# Patient Record
Sex: Female | Born: 1960 | Race: White | Hispanic: No | Marital: Married | Smoking: Never smoker
Health system: Southern US, Community
[De-identification: ages and names within clinical notes are randomized; demographics above are authoritative.]

## PROBLEM LIST (undated history)

## (undated) DIAGNOSIS — T7840XA Allergy, unspecified, initial encounter: Secondary | ICD-10-CM

## (undated) DIAGNOSIS — D649 Anemia, unspecified: Secondary | ICD-10-CM

## (undated) DIAGNOSIS — F419 Anxiety disorder, unspecified: Secondary | ICD-10-CM

## (undated) HISTORY — PX: EYE SURGERY: SHX253

## (undated) HISTORY — DX: Anemia, unspecified: D64.9

## (undated) HISTORY — DX: Allergy, unspecified, initial encounter: T78.40XA

## (undated) HISTORY — DX: Anxiety disorder, unspecified: F41.9

---

## 1994-01-05 HISTORY — PX: CERVICAL BIOPSY  W/ LOOP ELECTRODE EXCISION: SUR135

## 2003-09-19 ENCOUNTER — Other Ambulatory Visit: Admission: RE | Admit: 2003-09-19 | Discharge: 2003-09-19 | Payer: Self-pay | Admitting: Gynecology

## 2004-07-11 ENCOUNTER — Ambulatory Visit (HOSPITAL_COMMUNITY): Admission: RE | Admit: 2004-07-11 | Discharge: 2004-07-11 | Payer: Self-pay | Admitting: Gynecology

## 2005-02-09 ENCOUNTER — Other Ambulatory Visit: Admission: RE | Admit: 2005-02-09 | Discharge: 2005-02-09 | Payer: Self-pay | Admitting: Gynecology

## 2005-06-30 ENCOUNTER — Other Ambulatory Visit: Admission: RE | Admit: 2005-06-30 | Discharge: 2005-06-30 | Payer: Self-pay | Admitting: Gynecology

## 2005-09-27 ENCOUNTER — Ambulatory Visit: Payer: Self-pay | Admitting: Neonatology

## 2005-09-27 ENCOUNTER — Inpatient Hospital Stay (HOSPITAL_COMMUNITY): Admission: AD | Admit: 2005-09-27 | Discharge: 2005-10-07 | Payer: Self-pay | Admitting: Gynecology

## 2005-10-03 ENCOUNTER — Encounter (INDEPENDENT_AMBULATORY_CARE_PROVIDER_SITE_OTHER): Payer: Self-pay | Admitting: *Deleted

## 2005-10-08 ENCOUNTER — Encounter: Admission: RE | Admit: 2005-10-08 | Discharge: 2005-11-07 | Payer: Self-pay | Admitting: Gynecology

## 2005-11-08 ENCOUNTER — Encounter: Admission: RE | Admit: 2005-11-08 | Discharge: 2005-12-07 | Payer: Self-pay | Admitting: Gynecology

## 2005-11-18 ENCOUNTER — Other Ambulatory Visit: Admission: RE | Admit: 2005-11-18 | Discharge: 2005-11-18 | Payer: Self-pay | Admitting: Gynecology

## 2005-12-08 ENCOUNTER — Encounter: Admission: RE | Admit: 2005-12-08 | Discharge: 2006-01-07 | Payer: Self-pay | Admitting: Gynecology

## 2006-01-08 ENCOUNTER — Encounter: Admission: RE | Admit: 2006-01-08 | Discharge: 2006-02-07 | Payer: Self-pay | Admitting: Gynecology

## 2006-02-08 ENCOUNTER — Encounter: Admission: RE | Admit: 2006-02-08 | Discharge: 2006-03-08 | Payer: Self-pay | Admitting: Gynecology

## 2006-03-09 ENCOUNTER — Encounter: Admission: RE | Admit: 2006-03-09 | Discharge: 2006-03-26 | Payer: Self-pay | Admitting: Gynecology

## 2006-03-16 ENCOUNTER — Ambulatory Visit (HOSPITAL_COMMUNITY): Admission: RE | Admit: 2006-03-16 | Discharge: 2006-03-16 | Payer: Self-pay | Admitting: Gynecology

## 2006-11-22 ENCOUNTER — Other Ambulatory Visit: Admission: RE | Admit: 2006-11-22 | Discharge: 2006-11-22 | Payer: Self-pay | Admitting: Gynecology

## 2007-02-07 ENCOUNTER — Ambulatory Visit (HOSPITAL_COMMUNITY): Admission: RE | Admit: 2007-02-07 | Discharge: 2007-02-07 | Payer: Self-pay | Admitting: Gynecology

## 2007-09-08 ENCOUNTER — Ambulatory Visit: Payer: Self-pay | Admitting: Gynecology

## 2007-09-09 ENCOUNTER — Ambulatory Visit: Payer: Self-pay | Admitting: Gynecology

## 2007-09-26 ENCOUNTER — Ambulatory Visit: Payer: Self-pay | Admitting: Gynecology

## 2007-10-04 ENCOUNTER — Ambulatory Visit: Payer: Self-pay | Admitting: Gynecology

## 2007-10-06 ENCOUNTER — Ambulatory Visit: Payer: Self-pay | Admitting: Gynecology

## 2007-10-31 ENCOUNTER — Ambulatory Visit: Payer: Self-pay | Admitting: Gynecology

## 2007-11-03 ENCOUNTER — Ambulatory Visit: Payer: Self-pay | Admitting: Gynecology

## 2007-12-07 ENCOUNTER — Encounter: Payer: Self-pay | Admitting: Gynecology

## 2007-12-07 ENCOUNTER — Ambulatory Visit: Payer: Self-pay | Admitting: Gynecology

## 2007-12-07 ENCOUNTER — Other Ambulatory Visit: Admission: RE | Admit: 2007-12-07 | Discharge: 2007-12-07 | Payer: Self-pay | Admitting: Gynecology

## 2008-12-10 ENCOUNTER — Other Ambulatory Visit: Admission: RE | Admit: 2008-12-10 | Discharge: 2008-12-10 | Payer: Self-pay | Admitting: Gynecology

## 2008-12-10 ENCOUNTER — Ambulatory Visit: Payer: Self-pay | Admitting: Gynecology

## 2010-01-23 ENCOUNTER — Ambulatory Visit
Admission: RE | Admit: 2010-01-23 | Discharge: 2010-01-23 | Payer: Self-pay | Source: Home / Self Care | Attending: Gynecology | Admitting: Gynecology

## 2010-01-23 ENCOUNTER — Other Ambulatory Visit
Admission: RE | Admit: 2010-01-23 | Discharge: 2010-01-23 | Payer: Self-pay | Source: Home / Self Care | Admitting: Gynecology

## 2010-01-23 ENCOUNTER — Other Ambulatory Visit: Payer: Self-pay | Admitting: Gynecology

## 2010-01-27 ENCOUNTER — Ambulatory Visit: Admit: 2010-01-27 | Payer: Self-pay | Admitting: Gynecology

## 2010-02-18 ENCOUNTER — Other Ambulatory Visit: Payer: BC Managed Care – PPO | Admitting: Gynecology

## 2010-02-18 DIAGNOSIS — Z13220 Encounter for screening for lipoid disorders: Secondary | ICD-10-CM

## 2010-02-18 DIAGNOSIS — R635 Abnormal weight gain: Secondary | ICD-10-CM

## 2010-02-18 DIAGNOSIS — Z833 Family history of diabetes mellitus: Secondary | ICD-10-CM

## 2010-05-23 NOTE — H&P (Signed)
Patricia, Welch              ACCOUNT NO.:  [id number]   MEDICAL RECORD NO.:  [id number]          PATIENT TYPE:  INP   LOCATION:  9156                          FACILITY:  [facility]   PHYSICIAN:  Ivor Costa. Farrel Gobble, M.D. DATE OF BIRTH:  Apr 13, 1960   DATE OF ADMISSION:  09/27/2005  DATE OF DISCHARGE:                                HISTORY & PHYSICAL   HISTORY OF PRESENT ILLNESS:  The patient is a 50 year old G1 with estimated  date of confinement of January 18, 2006, estimated gestational age of 23-5/7  weeks who called earlier this morning complaining of abdominal pain, said it  kept her up all night.  The patient states she has also had some mild  spotting.  The patient's history is complicated by history of a LEEP, and we  have counseled her extensively about risks for incompetent cervix.  [place], and we  requested that she went to the nearest hospital.  At the hospital, the  patient was found to have contractions.  She had an ultrasound which showed  an intrauterine pregnancy.  Measurements were not done, however, Cervical  length was unable to be obtained.  There was no evidence of abruption.  The  patient was started on magnesium sulfate, as she was noted to be  contracting, and then was transferred to [facility].  She arrived here  around 3:30 on magnesium 2 g an hour.  She had also received a dose of  betamethasone as well as Clindamycin for GBS coverage.  The patient here  stated that her contractions had been about a 10/10 and now were less than  10 as well as less frequent.  She has not really had fetal movements as of  yet in this pregnancy.  She had had a urinalysis at the prior hospital as  well as a CBC which was remarkable for a white count of 13.8.  H&H of 13 and  40, and platelets of 371.  She also had a chemistry panel which was  unremarkable, as well as a neutrophil count which had a slight left  shift.  The patient's pregnancy has been complicated by advanced maternal age, and  the patient is 29 at delivery.  She did not have an amniocentesis, but  elected instead to have first trimester screening done at [facility]  which was unremarkable.  The patient had a normal second trimester  ultrasound for anatomy with a normal cervical length and otherwise had been  unremarkable.  Her prenatal labs are not available at the time of this  dictation.   OBSTETRIC/GYNECOLOGIC HISTORY:  Significant only for a LEEP done roughly  1993.  Her Pap smear this pregnancy had been normal.   PAST MEDICAL HISTORY:  Negative.   PAST SURGICAL HISTORY:  Negative.   MEDICATIONS:  Prenatal vitamins and calcium.   ALLERGIES:  PENICILLIN CAUSES RASH.   SOCIAL HISTORY:  Negative.   PHYSICAL EXAMINATION:  GENERAL:  She is a well-appearing gravida, no acute  distress.  She is afebrile and her vitals are  stable.  HEART:  Regular rate.  LUNGS:  Clear to auscultation.  ABDOMEN:  Gravid, soft, nontender.  A vaginal exam was performed and showed  her cervix to be short, closed with tones, and the presenting part against  the cervix.  Fetal heart rate was assuring consistent with gestational age.  Toco shows mild contractions approximately 8-12 minutes apart.  EXTREMITIES:  Negative.   ASSESSMENT:  Preterm contractions at 23+ weeks.  The patient will continue  on magnesium sulfate.  We will repeat her catheterization and sent her urine  for a culture.  We will continue the Clindamycin.  TBS will be performed by  nursing approximately two hours after the vaginal exam.  If the patient  remains undelivered, we may consider fetal fibronectin at minimum of 24  hours post vaginal exam.  She will get a repeat ultrasound done today for  presentation, estimated fetal weight and cervical length.  The patient will  remain on bedrest with a slight pelvic tilt.  The situation was discussed  with her and her husband,  and we will have an acute consult called.      Ivor Costa. Farrel Gobble, M.D.  Electronically Signed     THL/MEDQ  D:  09/27/2005  T:  09/28/2005  Job:  [id number]

## 2010-05-23 NOTE — Discharge Summary (Signed)
Patricia Welch, Patricia Welch              ACCOUNT NO.:  [id number]   MEDICAL RECORD NO.:  [id number]          PATIENT TYPE:  INP   LOCATION:  9101                          [facility]ILITY:  [facility]   PHYSICIAN:  Juan H. Lily Peer, M.D.DATE OF BIRTH:  10/11/1960   DATE OF ADMISSION:  09/27/2005  DATE OF DISCHARGE:  10/07/2005                                 DISCHARGE SUMMARY   HISTORY:  The patient is a 50 year old gravida 1 who was admitted on  September 27, 2005 at that 23-5/[redacted] weeks gestation secondary to preterm labor  and premature cervical effacement.  The patient had visited a hospital in  [place] and transferred to [facility].  She had been on magnesium  sulfate 2 grams per hour en route, as well as she had received clindamycin  for GBS coverage since she was allergic to penicillin.  The patient with  prior history of a LEEP cervical conization in 1993.   Aggressive therapy continued when she arrived to [facility].  She  continued to contract, and she was started on magnesium sulfate 3 grams per  hour.  Of note, she had just received one dose of betamethasone in [facility] and her second dose 24 hours later at [facility].  Efforts  were being made in an effort to wean her off her magnesium sulfate and  transition her to a terbutaline pump eventually.  Terbutaline pump was not  covered by her insurance carrier, and the patient was placed on Indocin for  48 hours and then switched to Procardia 10 mg p.o. q.6h.  The patient with  reassuring fetal heart rate tracings through much of her course.  She had  had an ultrasound done.  First, on September 27, 2005, there was a  transvaginal ultrasound that demonstrated the cervix only 6.8 mm in length,  and the fetus had been in the vertex presentation; size was consistent with  dates, and AFI had been normal for gestational age.  She also had an  ultrasound on September 29, 2005, at which point there was further any  cervical  length measurement, and fetal Doppler studies. systolic/diastolic  ratios had been normal.   She also had a follow-up ultrasound October 03, 2005, where she was found  to have bulging membranes and the vertex presentation, and it was at this  point where a pelvic examination had demonstrated there was bulging  membranes and she was 3 cm by ultrasound, and on pelvic exam she was found  to be 7 cm and was transferred to labor and delivery.  Of note, she had been  treated with Macrobid for a urinary tract infection after the clindamycin  had been discontinued, due to the fact the GBS cultures had been negative,  and she had been on Lovenox 30 mg subcutaneous daily for DVT prophylaxis.  With these findings, the perinatologist who had been consulted upon  admission agreed with proceeding with vaginal delivery at this point, since  all measures were exhausted to aggressively keep this pregnancy any longer,  and the neonatal team had been notified, as well.   She underwent  a rupture of membranes in an effort to deliver vaginally and  was taken subsequently for an emergency cesarean section secondary to fetal  bradycardia.  The patient delivered a viable female infant, Apgars 5 and 7,  710 grams.  Normal maternal pelvic anatomy.  Clear amniotic fluid.  The  pathology report from the placenta demonstrated chorioamnionitis with a  three-vessel cord.   Postoperatively, the patient had a hemoglobin and hematocrit 12.3 and 35.4.  She was O positive, rubella immune.  Postoperatively, she did well.  Her  Foley catheter was discontinued after 24 hours, and she was advanced from a  clear to a regular diet and was kept in the hospital until her fourth  postoperative day, whereby her staples were removed, her incision was Steri-  Stripped and she was ready to be discharged home.   FINAL DIAGNOSES:  1. Preterm labor at 23-1/2 weeks estimated gestational age.  2. Premature cervical effacement.  3.  Fetal distress.   PROCEDURES PERFORMED:  1. Perinatal consultation.  2. Magnesium tocolysis and nifedipine tocolysis  3. Intravenous antibiotics.  4. Serial ultrasounds.  5. Continuous fetal monitoring.  6. Emergency primary lower uterine segment transverse cesarean section.   FINAL DISPOSITION AND FOLLOWUP:  The patient was discharged home on her  fourth postoperative day.  She was given a prescription for Motrin 800 mg to  take one p.o. t.i.d. p.r.n.  She was given a prescription for Lortab 5.5/500  to take once p.o. q.4-6h. p.r.n. pain.  She is to continue on her prenatal  vitamins and iron.  She is to followup in the office in 6-week for her  postpartum visit.      Juan H. Lily Peer, M.D.  Electronically Signed     JHF/MEDQ  D:  10/26/2005  T:  10/27/2005  Job:  [id number]

## 2010-05-23 NOTE — Op Note (Signed)
NAMESHELLEY, COCKE              ACCOUNT NO.:  [id number]   MEDICAL RECORD NO.:  [id number]          PATIENT TYPE:  INP   LOCATION:  9168                          [facility]   PHYSICIAN:  Juan H. Lily Peer, M.D.DATE OF BIRTH:  Aug 24, 1960   DATE OF PROCEDURE:  10/03/2005  DATE OF DISCHARGE:                                 OPERATIVE REPORT   INDICATION FOR OPERATION:  The patient is a 50 year old gravida 1, para 0 at  24-5/7 weeks' gestation, who has been in the hospital since September 23  secondary to preterm labor.  The patient had almost no cervical length  measurement, had been placed on antibiotic, had received steroids and  tocolytics and had been weaned off magnesium sulfate and was eventually on  Procardia 10 mg q.6 h.  Ultrasound this morning had demonstrated the cervix  to be 3 cm with bulging membranes.  The patient was taken to labor and  delivery, and pelvic exam demonstrated her cervix was 7-8, and she rapidly  progressed to 9.  Her membranes were ruptured.  Fetal scalp electrode had  been placed.  The patient was having some mild variables and then  subsequently the mild variables transformed into deep variables and  subsequent repetitively and eventually fetal bradycardia into the 60 beats  per minute  range with a wandering baseline.  The patient was taken to the  operating room for emergency C-section and due to the fact that she had been  on Lovenox for DVT prophylaxis and received a dose approximately 3 hours  prior to this planned C-section, she was to undergo general endotracheal  anesthesia.   PREOPERATIVE DIAGNOSES:  1. Premature cervical dilatation, 24-1/2 weeks' estimated gestational age.  2. Fetal bradycardia.   POSTOPERATIVE DIAGNOSES:  1. Premature cervical dilatation, 24-1/2 weeks' estimated gestational age.  2. Fetal bradycardia.   ANESTHESIA:  General endotracheal anesthesia.   SURGEON:  Juan H. Lily Peer, M.D.   PROCEDURE PERFORMED:   Emergency lower uterine segment transverse cesarean  section.   FINDINGS:  Viable female infant, Apgars of 5 and 7 with a weight of 710  grams in the vertex presentation.  Clear amniotic fluid.  Normal maternal  pelvic anatomy.   DESCRIPTION OF OPERATION:  After the patient was emergently taken to the  operating room, fetal heart tones had been appreciated, but there were  audible decelerations noted as well.  She had an emergency prep of her  abdomen.  A Foley catheter was inserted.  The patient was immediately  intubated.  After the drapes were in place, a Pfannenstiel skin incision was  made.  This incision was carried down through the skin, subcutaneous tissue,  down to the rectus fascia by midline nick was made.  The fascia was incised  in a transverse fashion.  A midline raphe was entered.  The peritoneum was  entered rapidly and cautiously, and the bladder flap was established.  She  had a well-developed lower uterine segment so instead of proceeding with a  vertical lower uterine segment incision, she underwent a transverse cesarean  section.  The newborn's head was delivered.  The newborn nasopharyngeal area  was bulb suctioned.  The cord was clamped, passed off immediately to  neonatologists who were in attendance, who gave the above-mentioned  parameters.  After cord blood was obtained, the placenta was delivered from  the intrauterine cavity.  The uterus was exteriorized.  The intrauterine  cavity was swept clear of remaining products of conception.  The patient had  received clindamycin 900 mg when she was moved to labor and delivery short  before delivery, so no additional antibiotic was given intraoperatively.  The placenta was submitted for histological evaluation.  The transverse  incision was closed first with a locking stitch of 0 Vicryl suture followed  by a second layer in imbricating manner with 0 Vicryl suture. normal  maternal pelvic anatomy was noted.  The uterus  was placed back in the pelvic  cavity.  The pelvic cavity was copiously irrigated with normal saline  solution.  After ascertaining adequate hemostasis, the closure was started.  The visceral peritoneum was not closed, but the rectus fascia was closed  with a running stitch 0 Vicryl suture.  The bleeders were Bovie cauterized.  The skin was reapproximated with skin clips followed by placing Xeroform  gauze and 4 x 4 dressing.  The patient was extubated, transferred to  recovery room with stable vital signs.  Blood loss was 650 mL.  Urine output  200 mL and clear.  IV fluids 2700 mL of lactated Ringer's.      Juan H. Lily Peer, M.D.  Electronically Signed     JHF/MEDQ  D:  10/03/2005  T:  10/05/2005  Job:  [id number]

## 2011-02-12 ENCOUNTER — Encounter: Payer: Self-pay | Admitting: Gynecology

## 2011-02-18 ENCOUNTER — Encounter: Payer: BC Managed Care – PPO | Admitting: Gynecology

## 2011-02-24 ENCOUNTER — Other Ambulatory Visit (HOSPITAL_COMMUNITY)
Admission: RE | Admit: 2011-02-24 | Discharge: 2011-02-24 | Disposition: A | Discharge status: Home / Self Care | Payer: BC Managed Care – PPO | Source: Ambulatory Visit | Attending: Gynecology | Admitting: Gynecology

## 2011-02-24 ENCOUNTER — Ambulatory Visit (INDEPENDENT_AMBULATORY_CARE_PROVIDER_SITE_OTHER): Payer: BC Managed Care – PPO | Admitting: Gynecology

## 2011-02-24 ENCOUNTER — Encounter: Payer: Self-pay | Admitting: Gynecology

## 2011-02-24 VITALS — BP 104/68 | Ht 62.75 in | Wt 129.0 lb

## 2011-02-24 DIAGNOSIS — N871 Moderate cervical dysplasia: Secondary | ICD-10-CM | POA: Insufficient documentation

## 2011-02-24 DIAGNOSIS — Z01419 Encounter for gynecological examination (general) (routine) without abnormal findings: Secondary | ICD-10-CM | POA: Insufficient documentation

## 2011-02-24 DIAGNOSIS — Z0000 Encounter for general adult medical examination without abnormal findings: Secondary | ICD-10-CM

## 2011-02-24 LAB — URINALYSIS W MICROSCOPIC + REFLEX CULTURE
Bilirubin Urine: NEGATIVE
Casts: NONE SEEN
Crystals: NONE SEEN
Specific Gravity, Urine: 1.02 (ref 1.005–1.030)
WBC, UA: NONE SEEN WBC/hpf (ref <3)
pH: 5.5 (ref 5.0–8.0)

## 2011-02-24 NOTE — Progress Notes (Signed)
Patricia Welch 06-May-1960 [id number]   History:    51 y.o.  for annual exam with no complaints today. 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 We have no documentation. Subsequent to that her Pap smears been normal. Last mammogram February this year normal. And no contraception. Regular menstrual cycles. Strong family history diabetes (mother non-insulin-dependent diabetic).  Past medical history,surgical history, family history and social history were all reviewed and documented in the EPIC chart.  Gynecologic History Patient's last menstrual period was 02/17/2011. Contraception: none Last Pap: 2012. Results were: normal Last mammogram: 2013. Results were: normal  Obstetric History OB History    Grav Para Term Preterm Abortions TAB SAB Ect Mult Living   1 1        0     # Outc Date GA Lbr Len/2nd Wgt Sex Del Anes PTL Lv   1 PAR                ROS:  Was performed and pertinent positives and negatives are included in the history.  Exam: chaperone present  BP 104/68  Ht 5' 2.75" (1.594 m)  Wt 129 lb (58.514 kg)  BMI 23.03 kg/m2  LMP 02/17/2011  Body mass index is 23.03 kg/(m^2).  General appearance : Well developed well nourished female. No acute distress HEENT: Neck supple, trachea midline, no carotid bruits, no thyroidmegaly Lungs: Clear to auscultation, no rhonchi or wheezes, or rib retractions  Heart: Regular rate and rhythm, no murmurs or gallops Breast:Examined in sitting and supine position were symmetrical in appearance, no palpable masses or tenderness,  no skin retraction, no nipple inversion, no nipple discharge, no skin discoloration, no axillary or supraclavicular lymphadenopathy Abdomen: no palpable masses or tenderness, no rebound or guarding Extremities: no edema or skin discoloration or tenderness  Pelvic:  Bartholin, Urethra,  Skene Glands: Within normal limits             Vagina: No gross lesions or discharge  Cervix: No gross lesions or discharge  Uterus  anteverted, normal size, shape and consistency, non-tender and mobile  Adnexa  Without masses or tenderness  Anus and perineum  normal   Rectovaginal  normal sphincter tone without palpated masses or tenderness             Hemoccult not done     Assessment/Plan:  51 y.o. female for annual exam which was normal today. Due to patient's history of severe dysplasia in the past would recommend continuing annual Pap smears. patient is encouraged to do her monthly self breast examination. The following labs will be drawn today random blood sugar, CBC, urinalysis, fasting lipid profile, along with her Pap smear. She was encouraged to calcium vitamin D for osteoporosis prevention. We'll see her back pneumonia or when necessary.   Ok Edwards MD, 9:48 AM 02/24/2011

## 2011-02-24 NOTE — Patient Instructions (Addendum)
Breast Self-Exam A self breast exam may help you find changes or problems while they are still small. Do a breast self-exam:  Every month.   One week after your period (menstrual period).   On the first day of each month if you do not have periods anymore.  Look for any:  Change in breast color, size, or shape.   Dimples in your breast.   Changes in your nipples or skin.   Dry skin on your breasts or nipples.   Watery or bloody discharge from your nipples.   Feel for:  Lumps.   Thick, hard places.   Any other changes.  HOME CARE There are 3 ways to do the breast self-exam: In front of a mirror.  Lift your arms over your head and turn side to side.   Put your hands on your hips and lean down, then turn from side to side.   Bend forward and turn from side to side.  In the shower.  With soapy hands, check both breasts. Then check above and below your collarbone and your armpits.   Feel above and below your collarbone down to under your breast, and from the center of your chest to the outer edge of the armpit. Check for any lumps or hard spots.   Using the tips of your middle three fingers check your whole breast by pressing your hand over your breast in a circle or in an up and down motion.  Lying down.  Lie flat on your bed.   Put a small pillow under the breast you are going to check. On that same side, put your hand behind your head.   With your other hand, use the 3 middle fingers to feel the breast.   Move your fingers in a circle around the breast. Press firmly over all parts of the breast to feel for any lumps.  GET HELP RIGHT AWAY IF: You find any changes in your breasts so they can be checked. Document Released: 06/10/2007 Document Revised: 09/03/2010 Document Reviewed: 04/11/2008 [facility] Patient Information 2012 [place], [place].  Remember to take one tablet of either one of the following: Caltrate Plus or Oscal or Citracal or viactiv (Source for Calcium  and Vitamin D)

## 2011-02-25 LAB — CBC WITH DIFFERENTIAL/PLATELET
HCT: 41.8 % (ref 36.0–46.0)
Lymphs Abs: 1.9 10*3/uL (ref 0.7–4.0)
Neutro Abs: 2.2 10*3/uL (ref 1.7–7.7)
Neutrophils Relative %: 49 % (ref 43–77)
Platelets: 279 10*3/uL (ref 150–400)
RBC: 4.31 MIL/uL (ref 3.87–5.11)
RDW: 12.1 % (ref 11.5–15.5)
WBC: 4.6 10*3/uL (ref 4.0–10.5)

## 2011-02-25 LAB — LIPID PANEL
LDL Cholesterol: 112 mg/dL — ABNORMAL HIGH (ref 0–99)
Total CHOL/HDL Ratio: 3.4 Ratio

## 2012-02-09 ENCOUNTER — Ambulatory Visit: Payer: BC Managed Care – PPO | Admitting: Family Medicine

## 2012-04-08 ENCOUNTER — Encounter: Payer: Self-pay | Admitting: Family Medicine

## 2012-04-08 ENCOUNTER — Ambulatory Visit (INDEPENDENT_AMBULATORY_CARE_PROVIDER_SITE_OTHER): Payer: 59 | Admitting: Family Medicine

## 2012-04-08 VITALS — BP 104/68 | HR 65 | Temp 98.3°F | Ht 62.5 in | Wt 129.6 lb

## 2012-04-08 DIAGNOSIS — Z0000 Encounter for general adult medical examination without abnormal findings: Secondary | ICD-10-CM

## 2012-04-08 DIAGNOSIS — Z8249 Family history of ischemic heart disease and other diseases of the circulatory system: Secondary | ICD-10-CM

## 2012-04-08 DIAGNOSIS — R636 Underweight: Secondary | ICD-10-CM

## 2012-04-08 LAB — CBC WITH DIFFERENTIAL/PLATELET
Basophils Relative: 0.6 % (ref 0.0–3.0)
Eosinophils Absolute: 0.1 10*3/uL (ref 0.0–0.7)
Hemoglobin: 13.6 g/dL (ref 12.0–15.0)
Lymphocytes Relative: 38.9 % (ref 12.0–46.0)
Lymphs Abs: 1.6 10*3/uL (ref 0.7–4.0)
MCHC: 34.3 g/dL (ref 30.0–36.0)
MCV: 91.9 fl (ref 78.0–100.0)
Monocytes Absolute: 0.3 10*3/uL (ref 0.1–1.0)
Monocytes Relative: 6.7 % (ref 3.0–12.0)
Neutro Abs: 2.1 10*3/uL (ref 1.4–7.7)
WBC: 4.1 10*3/uL — ABNORMAL LOW (ref 4.5–10.5)

## 2012-04-08 LAB — HEPATIC FUNCTION PANEL
ALT: 15 U/L (ref 0–35)
AST: 22 U/L (ref 0–37)
Alkaline Phosphatase: 47 U/L (ref 39–117)
Total Protein: 7.3 g/dL (ref 6.0–8.3)

## 2012-04-08 LAB — LIPID PANEL
HDL: 55.6 mg/dL (ref >39.00)
Total CHOL/HDL Ratio: 3

## 2012-04-08 LAB — BASIC METABOLIC PANEL
BUN: 12 mg/dL (ref 6–23)
GFR: 88.56 mL/min (ref >60.00)

## 2012-04-08 NOTE — Patient Instructions (Addendum)
Preventive Care for Adults, Female A healthy lifestyle and preventive care can promote health and wellness. Preventive health guidelines for women include the following key practices.  A routine yearly physical is a good way to check with your caregiver about your health and preventive screening. It is a chance to share any concerns and updates on your health, and to receive a thorough exam.  Visit your dentist for a routine exam and preventive care every 6 months. Brush your teeth twice a day and floss once a day. Good oral hygiene prevents tooth decay and gum disease.  The frequency of eye exams is based on your age, health, family medical history, use of contact lenses, and other factors. Follow your caregiver's recommendations for frequency of eye exams.  Eat a healthy diet. Foods like vegetables, fruits, whole grains, low-fat dairy products, and lean protein foods contain the nutrients you need without too many calories. Decrease your intake of foods high in solid fats, added sugars, and salt. Eat the right amount of calories for you.Get information about a proper diet from your caregiver, if necessary.  Regular physical exercise is one of the most important things you can do for your health. Most adults should get at least 150 minutes of moderate-intensity exercise (any activity that increases your heart rate and causes you to sweat) each week. In addition, most adults need muscle-strengthening exercises on 2 or more days a week.  Maintain a healthy weight. The body mass index (BMI) is a screening tool to identify possible weight problems. It provides an estimate of body fat based on height and weight. Your caregiver can help determine your BMI, and can help you achieve or maintain a healthy weight.For adults 20 years and older:  A BMI below 18.5 is considered underweight.  A BMI of 18.5 to 24.9 is normal.  A BMI of 25 to 29.9 is considered overweight.  A BMI of 30 and above is  considered obese.  Maintain normal blood lipids and cholesterol levels by exercising and minimizing your intake of saturated fat. Eat a balanced diet with plenty of fruit and vegetables. Blood tests for lipids and cholesterol should begin at age 20 and be repeated every 5 years. If your lipid or cholesterol levels are high, you are over 50, or you are at high risk for heart disease, you may need your cholesterol levels checked more frequently.Ongoing high lipid and cholesterol levels should be treated with medicines if diet and exercise are not effective.  If you smoke, find out from your caregiver how to quit. If you do not use tobacco, do not start.  If you are pregnant, do not drink alcohol. If you are breastfeeding, be very cautious about drinking alcohol. If you are not pregnant and choose to drink alcohol, do not exceed 1 drink per day. One drink is considered to be 12 ounces (355 mL) of beer, 5 ounces (148 mL) of wine, or 1.5 ounces (44 mL) of liquor.  Avoid use of street drugs. Do not share needles with anyone. Ask for help if you need support or instructions about stopping the use of drugs.  High blood pressure causes heart disease and increases the risk of stroke. Your blood pressure should be checked at least every 1 to 2 years. Ongoing high blood pressure should be treated with medicines if weight loss and exercise are not effective.  If you are 55 to 50 years old, ask your caregiver if you should take aspirin to prevent strokes.  Diabetes   screening involves taking a blood sample to check your fasting blood sugar level. This should be done once every 3 years, after age 45, if you are within normal weight and without risk factors for diabetes. Testing should be considered at a younger age or be carried out more frequently if you are overweight and have at least 1 risk factor for diabetes.  Breast cancer screening is essential preventive care for women. You should practice "breast  self-awareness." This means understanding the normal appearance and feel of your breasts and may include breast self-examination. Any changes detected, no matter how small, should be reported to a caregiver. Women in their 20s and 30s should have a clinical breast exam (CBE) by a caregiver as part of a regular health exam every 1 to 3 years. After age 40, women should have a CBE every year. Starting at age 40, women should consider having a mammography (breast X-ray test) every year. Women who have a family history of breast cancer should talk to their caregiver about genetic screening. Women at a high risk of breast cancer should talk to their caregivers about having magnetic resonance imaging (MRI) and a mammography every year.  The Pap test is a screening test for cervical cancer. A Pap test can show cell changes on the cervix that might become cervical cancer if left untreated. A Pap test is a procedure in which cells are obtained and examined from the lower end of the uterus (cervix).  Women should have a Pap test starting at age 21.  Between ages 21 and 29, Pap tests should be repeated every 2 years.  Beginning at age 30, you should have a Pap test every 3 years as long as the past 3 Pap tests have been normal.  Some women have medical problems that increase the chance of getting cervical cancer. Talk to your caregiver about these problems. It is especially important to talk to your caregiver if a new problem develops soon after your last Pap test. In these cases, your caregiver may recommend more frequent screening and Pap tests.  The above recommendations are the same for women who have or have not gotten the vaccine for human papillomavirus (HPV).  If you had a hysterectomy for a problem that was not cancer or a condition that could lead to cancer, then you no longer need Pap tests. Even if you no longer need a Pap test, a regular exam is a good idea to make sure no other problems are  starting.  If you are between ages 65 and 70, and you have had normal Pap tests going back 10 years, you no longer need Pap tests. Even if you no longer need a Pap test, a regular exam is a good idea to make sure no other problems are starting.  If you have had past treatment for cervical cancer or a condition that could lead to cancer, you need Pap tests and screening for cancer for at least 20 years after your treatment.  If Pap tests have been discontinued, risk factors (such as a new sexual partner) need to be reassessed to determine if screening should be resumed.  The HPV test is an additional test that may be used for cervical cancer screening. The HPV test looks for the virus that can cause the cell changes on the cervix. The cells collected during the Pap test can be tested for HPV. The HPV test could be used to screen women aged 30 years and older, and should   be used in women of any age who have unclear Pap test results. After the age of 30, women should have HPV testing at the same frequency as a Pap test.  Colorectal cancer can be detected and often prevented. Most routine colorectal cancer screening begins at the age of 50 and continues through age 75. However, your caregiver may recommend screening at an earlier age if you have risk factors for colon cancer. On a yearly basis, your caregiver may provide home test kits to check for hidden blood in the stool. Use of a small camera at the end of a tube, to directly examine the colon (sigmoidoscopy or colonoscopy), can detect the earliest forms of colorectal cancer. Talk to your caregiver about this at age 50, when routine screening begins. Direct examination of the colon should be repeated every 5 to 10 years through age 75, unless early forms of pre-cancerous polyps or small growths are found.  Hepatitis C blood testing is recommended for all people born from 1945 through 1965 and any individual with known risks for hepatitis C.  Practice  safe sex. Use condoms and avoid high-risk sexual practices to reduce the spread of sexually transmitted infections (STIs). STIs include gonorrhea, chlamydia, syphilis, trichomonas, herpes, HPV, and human immunodeficiency virus (HIV). Herpes, HIV, and HPV are viral illnesses that have no cure. They can result in disability, cancer, and death. Sexually active women aged 25 and younger should be checked for chlamydia. Older women with new or multiple partners should also be tested for chlamydia. Testing for other STIs is recommended if you are sexually active and at increased risk.  Osteoporosis is a disease in which the bones lose minerals and strength with aging. This can result in serious bone fractures. The risk of osteoporosis can be identified using a bone density scan. Women ages 65 and over and women at risk for fractures or osteoporosis should discuss screening with their caregivers. Ask your caregiver whether you should take a calcium supplement or vitamin D to reduce the rate of osteoporosis.  Menopause can be associated with physical symptoms and risks. Hormone replacement therapy is available to decrease symptoms and risks. You should talk to your caregiver about whether hormone replacement therapy is right for you.  Use sunscreen with sun protection factor (SPF) of 30 or more. Apply sunscreen liberally and repeatedly throughout the day. You should seek shade when your shadow is shorter than you. Protect yourself by wearing long sleeves, pants, a wide-brimmed hat, and sunglasses year round, whenever you are outdoors.  Once a month, do a whole body skin exam, using a mirror to look at the skin on your back. Notify your caregiver of new moles, moles that have irregular borders, moles that are larger than a pencil eraser, or moles that have changed in shape or color.  Stay current with required immunizations.  Influenza. You need a dose every fall (or winter). The composition of the flu vaccine  changes each year, so being vaccinated once is not enough.  Pneumococcal polysaccharide. You need 1 to 2 doses if you smoke cigarettes or if you have certain chronic medical conditions. You need 1 dose at age 65 (or older) if you have never been vaccinated.  Tetanus, diphtheria, pertussis (Tdap, Td). Get 1 dose of Tdap vaccine if you are younger than age 65, are over 65 and have contact with an infant, are a healthcare worker, are pregnant, or simply want to be protected from whooping cough. After that, you need a Td   booster dose every 10 years. Consult your caregiver if you have not had at least 3 tetanus and diphtheria-containing shots sometime in your life or have a deep or dirty wound.  HPV. You need this vaccine if you are a woman age 26 or younger. The vaccine is given in 3 doses over 6 months.  Measles, mumps, rubella (MMR). You need at least 1 dose of MMR if you were born in 1957 or later. You may also need a second dose.  Meningococcal. If you are age 19 to 21 and a first-year college student living in a residence hall, or have one of several medical conditions, you need to get vaccinated against meningococcal disease. You may also need additional booster doses.  Zoster (shingles). If you are age 60 or older, you should get this vaccine.  Varicella (chickenpox). If you have never had chickenpox or you were vaccinated but received only 1 dose, talk to your caregiver to find out if you need this vaccine.  Hepatitis A. You need this vaccine if you have a specific risk factor for hepatitis A virus infection or you simply wish to be protected from this disease. The vaccine is usually given as 2 doses, 6 to 18 months apart.  Hepatitis B. You need this vaccine if you have a specific risk factor for hepatitis B virus infection or you simply wish to be protected from this disease. The vaccine is given in 3 doses, usually over 6 months. Preventive Services / Frequency Ages 19 to 39  Blood  pressure check.** / Every 1 to 2 years.  Lipid and cholesterol check.** / Every 5 years beginning at age 20.  Clinical breast exam.** / Every 3 years for women in their 20s and 30s.  Pap test.** / Every 2 years from ages 21 through 29. Every 3 years starting at age 30 through age 65 or 70 with a history of 3 consecutive normal Pap tests.  HPV screening.** / Every 3 years from ages 30 through ages 65 to 70 with a history of 3 consecutive normal Pap tests.  Hepatitis C blood test.** / For any individual with known risks for hepatitis C.  Skin self-exam. / Monthly.  Influenza immunization.** / Every year.  Pneumococcal polysaccharide immunization.** / 1 to 2 doses if you smoke cigarettes or if you have certain chronic medical conditions.  Tetanus, diphtheria, pertussis (Tdap, Td) immunization. / A one-time dose of Tdap vaccine. After that, you need a Td booster dose every 10 years.  HPV immunization. / 3 doses over 6 months, if you are 26 and younger.  Measles, mumps, rubella (MMR) immunization. / You need at least 1 dose of MMR if you were born in 1957 or later. You may also need a second dose.  Meningococcal immunization. / 1 dose if you are age 19 to 21 and a first-year college student living in a residence hall, or have one of several medical conditions, you need to get vaccinated against meningococcal disease. You may also need additional booster doses.  Varicella immunization.** / Consult your caregiver.  Hepatitis A immunization.** / Consult your caregiver. 2 doses, 6 to 18 months apart.  Hepatitis B immunization.** / Consult your caregiver. 3 doses usually over 6 months. Ages 40 to 64  Blood pressure check.** / Every 1 to 2 years.  Lipid and cholesterol check.** / Every 5 years beginning at age 20.  Clinical breast exam.** / Every year after age 40.  Mammogram.** / Every year beginning at age 40   and continuing for as long as you are in good health. Consult with your  caregiver.  Pap test.** / Every 3 years starting at age 30 through age 65 or 70 with a history of 3 consecutive normal Pap tests.  HPV screening.** / Every 3 years from ages 30 through ages 65 to 70 with a history of 3 consecutive normal Pap tests.  Fecal occult blood test (FOBT) of stool. / Every year beginning at age 50 and continuing until age 75. You may not need to do this test if you get a colonoscopy every 10 years.  Flexible sigmoidoscopy or colonoscopy.** / Every 5 years for a flexible sigmoidoscopy or every 10 years for a colonoscopy beginning at age 50 and continuing until age 75.  Hepatitis C blood test.** / For all people born from 1945 through 1965 and any individual with known risks for hepatitis C.  Skin self-exam. / Monthly.  Influenza immunization.** / Every year.  Pneumococcal polysaccharide immunization.** / 1 to 2 doses if you smoke cigarettes or if you have certain chronic medical conditions.  Tetanus, diphtheria, pertussis (Tdap, Td) immunization.** / A one-time dose of Tdap vaccine. After that, you need a Td booster dose every 10 years.  Measles, mumps, rubella (MMR) immunization. / You need at least 1 dose of MMR if you were born in 1957 or later. You may also need a second dose.  Varicella immunization.** / Consult your caregiver.  Meningococcal immunization.** / Consult your caregiver.  Hepatitis A immunization.** / Consult your caregiver. 2 doses, 6 to 18 months apart.  Hepatitis B immunization.** / Consult your caregiver. 3 doses, usually over 6 months. Ages 65 and over  Blood pressure check.** / Every 1 to 2 years.  Lipid and cholesterol check.** / Every 5 years beginning at age 20.  Clinical breast exam.** / Every year after age 40.  Mammogram.** / Every year beginning at age 40 and continuing for as long as you are in good health. Consult with your caregiver.  Pap test.** / Every 3 years starting at age 30 through age 65 or 70 with a 3  consecutive normal Pap tests. Testing can be stopped between 65 and 70 with 3 consecutive normal Pap tests and no abnormal Pap or HPV tests in the past 10 years.  HPV screening.** / Every 3 years from ages 30 through ages 65 or 70 with a history of 3 consecutive normal Pap tests. Testing can be stopped between 65 and 70 with 3 consecutive normal Pap tests and no abnormal Pap or HPV tests in the past 10 years.  Fecal occult blood test (FOBT) of stool. / Every year beginning at age 50 and continuing until age 75. You may not need to do this test if you get a colonoscopy every 10 years.  Flexible sigmoidoscopy or colonoscopy.** / Every 5 years for a flexible sigmoidoscopy or every 10 years for a colonoscopy beginning at age 50 and continuing until age 75.  Hepatitis C blood test.** / For all people born from 1945 through 1965 and any individual with known risks for hepatitis C.  Osteoporosis screening.** / A one-time screening for women ages 65 and over and women at risk for fractures or osteoporosis.  Skin self-exam. / Monthly.  Influenza immunization.** / Every year.  Pneumococcal polysaccharide immunization.** / 1 dose at age 65 (or older) if you have never been vaccinated.  Tetanus, diphtheria, pertussis (Tdap, Td) immunization. / A one-time dose of Tdap vaccine if you are over   65 and have contact with an infant, are a healthcare worker, or simply want to be protected from whooping cough. After that, you need a Td booster dose every 10 years.  Varicella immunization.** / Consult your caregiver.  Meningococcal immunization.** / Consult your caregiver.  Hepatitis A immunization.** / Consult your caregiver. 2 doses, 6 to 18 months apart.  Hepatitis B immunization.** / Check with your caregiver. 3 doses, usually over 6 months. ** Family history and personal history of risk and conditions may change your caregiver's recommendations. Document Released: 02/17/2001 Document Revised: 03/16/2011  Document Reviewed: 05/19/2010 [employer] Patient Information 2013 [employer].  

## 2012-04-08 NOTE — Assessment & Plan Note (Deleted)
Megace rx given to pt rto 1 month or sooner prn

## 2012-04-08 NOTE — Progress Notes (Signed)
Subjective:     Patricia Welch is a 52 y.o. female and is here for a comprehensive physical exam. The patient reports no problems.  History   Social History  . Marital Status: Married    Spouse Name: N/A    Number of Children: N/A  . Years of Education: N/A   Occupational History  . [employer]--- accounting    Social History Main Topics  . Smoking status: Never Smoker   . Smokeless tobacco: Never Used  . Alcohol Use: Yes     Comment: rare glass of wine  . Drug Use: No  . Sexually Active: Yes -- Female partner(s)    Birth Control/ Protection: None   Other Topics Concern  . Not on file   Social History Narrative   Exercise--  Recently started   Health Maintenance  Topic Date Due  . Mammogram  02/10/2012  . Influenza Vaccine  09/05/2012  . Pap Smear  02/23/2014  . Tetanus/tdap  07/12/2018    The following portions of the patient's history were reviewed and updated as appropriate:  She  has no past medical history on file. She  does not have any pertinent problems on file. She  has past surgical history that includes Cesarean section and Cervical biopsy w/ loop electrode excision (1996). Her family history includes Alcohol abuse in her father; Diabetes in her mother; Heart disease (age of onset: 36) in her father; and Hypertension in her mother. She  reports that she has never smoked. She has never used smokeless tobacco. She reports that  drinks alcohol. She reports that she does not use illicit drugs. She has a current medication list which includes the following prescription(s): multiple vitamins-minerals. Current Outpatient Prescriptions on File Prior to Visit  Medication Sig Dispense Refill  . Multiple Vitamins-Minerals (MULTIVITAMIN PO) Take by mouth.       No current facility-administered medications on file prior to visit.   She is allergic to penicillins..  Review of Systems Review of Systems  Constitutional: Negative for activity change, appetite change and  fatigue.  HENT: Negative for hearing loss, congestion, tinnitus and ear discharge.  dentist q17m Eyes: Negative for visual disturbance (see optho q2y -- vision corrected to 20/20 with glasses).  Respiratory: Negative for cough, chest tightness and shortness of breath.   Cardiovascular: Negative for chest pain, palpitations and leg swelling.  Gastrointestinal: Negative for abdominal pain, diarrhea, constipation and abdominal distention.  Genitourinary: Negative for urgency, frequency, decreased urine volume and difficulty urinating.  Musculoskeletal: Negative for back pain, arthralgias and gait problem.  Skin: Negative for color change, pallor and rash.  Neurological: Negative for dizziness, light-headedness, numbness and headaches.  Hematological: Negative for adenopathy. Does not bruise/bleed easily.  Psychiatric/Behavioral: Negative for suicidal ideas, confusion, sleep disturbance, self-injury, dysphoric mood, decreased concentration and agitation.       Objective:    BP 104/68  Pulse 65  Temp(Src) 98.3 F (36.8 C) (Oral)  Ht 5' 2.5" (1.588 m)  Wt 129 lb 9.6 oz (58.786 kg)  BMI 23.31 kg/m2  SpO2 96% General appearance: AAOx3  NAD Head: Normocephalic, without obvious abnormality, atraumatic Eyes: conjunctivae/corneas clear. PERRL, EOM's intact. Fundi benign. Ears: normal TM's and external ear canals both ears Nose: Nares normal. Septum midline. Mucosa normal. No drainage or sinus tenderness. Throat: lips, mucosa, and tongue normal; teeth and gums normal Neck: no adenopathy, no carotid bruit, no JVD, supple, symmetrical, trachea midline and thyroid not enlarged, symmetric, no tenderness/mass/nodules Back: symmetric, no curvature. ROM normal. No CVA  tenderness. Lungs: clear to auscultation bilaterally Breasts: gyn Heart: regular rate and rhythm, S1, S2 normal, no murmur, click, rub or gallop Abdomen: soft, non-tender; bowel sounds normal; no masses,  no organomegaly Pelvic:  deferred-gyn Extremities: extremities normal, atraumatic, no cyanosis or edema Pulses: 2+ and symmetric Skin: Skin color, texture, turgor normal. No rashes or lesions Lymph nodes: Cervical, supraclavicular, and axillary nodes normal. Neurologic: Alert and oriented X 3, normal strength and tone. Normal symmetric reflexes. Normal coordination and gait Psych--no anxiety, no depression      Assessment:    Healthy female exam.      Plan:    check fasting labs ghm utd---pap and mammo per gyn See After Visit Summary for Counseling Recommendations

## 2012-04-12 LAB — POCT URINALYSIS DIPSTICK
Glucose, UA: NEGATIVE
Ketones, UA: NEGATIVE
Protein, UA: NEGATIVE
pH, UA: 6.5

## 2012-07-04 ENCOUNTER — Encounter: Payer: Self-pay | Admitting: Anesthesiology

## 2012-07-06 ENCOUNTER — Encounter: Payer: Self-pay | Admitting: Gynecology

## 2012-07-20 ENCOUNTER — Encounter: Payer: Self-pay | Admitting: Gynecology

## 2012-07-26 ENCOUNTER — Encounter: Payer: Self-pay | Admitting: Gynecology

## 2012-08-22 ENCOUNTER — Encounter: Payer: Self-pay | Admitting: Gynecology

## 2012-09-16 ENCOUNTER — Encounter: Payer: Self-pay | Admitting: Gynecology

## 2012-10-10 ENCOUNTER — Encounter: Payer: Self-pay | Admitting: Gynecology

## 2012-10-10 ENCOUNTER — Ambulatory Visit (INDEPENDENT_AMBULATORY_CARE_PROVIDER_SITE_OTHER): Payer: 59 | Admitting: Gynecology

## 2012-10-10 ENCOUNTER — Other Ambulatory Visit (HOSPITAL_COMMUNITY)
Admission: RE | Admit: 2012-10-10 | Discharge: 2012-10-10 | Disposition: A | Discharge status: Home / Self Care | Payer: 59 | Source: Ambulatory Visit | Attending: Gynecology | Admitting: Gynecology

## 2012-10-10 VITALS — BP 124/84 | Ht 62.0 in | Wt 129.2 lb

## 2012-10-10 DIAGNOSIS — Z01419 Encounter for gynecological examination (general) (routine) without abnormal findings: Secondary | ICD-10-CM | POA: Insufficient documentation

## 2012-10-10 DIAGNOSIS — N3281 Overactive bladder: Secondary | ICD-10-CM | POA: Insufficient documentation

## 2012-10-10 DIAGNOSIS — N318 Other neuromuscular dysfunction of bladder: Secondary | ICD-10-CM

## 2012-10-10 DIAGNOSIS — Z1151 Encounter for screening for human papillomavirus (HPV): Secondary | ICD-10-CM | POA: Insufficient documentation

## 2012-10-10 MED ORDER — FESOTERODINE FUMARATE ER 4 MG PO TB24: 4.0000 mg | ORAL_TABLET | Freq: Every day | ORAL | Status: DC

## 2012-10-10 NOTE — Progress Notes (Signed)
Patricia Welch February 14, 1960 [id number]   History:    52 y.o.  for annual gyn exam who was complaining of urinary frequency during the day and having to get up at least once at night. 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 We have no documentation. Subsequent to that her Pap smears been normal.patient has a very strong family history of diabetes (mother non-insulin-dependent diabetic).. Patient not interested and flu vaccine.   Past medical history,surgical history, family history and social history were all reviewed and documented in the EPIC chart.  Gynecologic History Patient's last menstrual period was 04/10/2012. Contraception: none Last Pap: 2013. Results were: normal Last mammogram: 2014. Results were: normal but dense  Obstetric History OB History  Gravida Para Term Preterm AB SAB TAB Ectopic Multiple Living  1 1        0    # Outcome Date GA Lbr Len/2nd Weight Sex Delivery Anes PTL Lv  1 PAR                ROS: A ROS was performed and pertinent positives and negatives are included in the history.  GENERAL: No fevers or chills. HEENT: No change in vision, no earache, sore throat or sinus congestion. NECK: No pain or stiffness. CARDIOVASCULAR: No chest pain or pressure. No palpitations. PULMONARY: No shortness of breath, cough or wheeze. GASTROINTESTINAL: No abdominal pain, nausea, vomiting or diarrhea, melena or bright red blood per rectum. GENITOURINARY: No urinary frequency, urgency, hesitancy or dysuria. MUSCULOSKELETAL: No joint or muscle pain, no back pain, no recent trauma. DERMATOLOGIC: No rash, no itching, no lesions. ENDOCRINE: No polyuria, polydipsia, no heat or cold intolerance. No recent change in weight. HEMATOLOGICAL: No anemia or easy bruising or  bleeding. NEUROLOGIC: No headache, seizures, numbness, tingling or weakness. PSYCHIATRIC: No depression, no loss of interest in normal activity or change in sleep pattern.     Exam: chaperone present  BP 124/84  Ht 5\' 2"  (1.575 m)  Wt 129 lb 3.2 oz (58.605 kg)  BMI 23.63 kg/m2  LMP 04/10/2012  Body mass index is 23.63 kg/(m^2).  General appearance : Well developed well nourished female. No acute distress HEENT: Neck supple, trachea midline, no carotid bruits, no thyroidmegaly Lungs: Clear to auscultation, no rhonchi or wheezes, or rib retractions  Heart: Regular rate and rhythm, no murmurs or gallops Breast:Examined in sitting and supine position were symmetrical in appearance, no palpable masses or tenderness,  no skin retraction, no nipple inversion, no nipple discharge, no skin discoloration, no axillary or supraclavicular lymphadenopathy Abdomen: no palpable masses or tenderness, no rebound or guarding Extremities: no edema or skin discoloration or tenderness  Pelvic:  Bartholin, Urethra, Skene Glands: Within normal limits             Vagina: No gross lesions or discharge  Cervix: No gross lesions or discharge  Uterus  anteverted, normal size, shape and consistency, non-tender and mobile  Adnexa  Without masses or tenderness  Anus and perineum  normal   Rectovaginal  normal sphincter tone without palpated masses or tenderness             Hemoccult not indicated     Assessment/Plan:  52 y.o. 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 We  discussed different types of anticholinergic treatment. She will be placed on Toviaz 4 mg daily. The risks benefits and pros and cons were discussed. Patient denies any history of narrow angle glaucoma. Patient is having normal menstrual cycles. She knows she has to stop this medication if she were to get pregnant. Literature and information was provided. She was monitored begin to  take calcium and vitamin  D  for osteoporosis prevention. Her PCP recently did her lab work. Because of her past history of of severe dysplasia we will continue to do Pap smears every year.  Note: This dictation was prepared with  Dragon/digital dictation along withSmart phrase technology. Any transcriptional errors that result from this process are unintentional.   Ok Edwards MD, 6:10 PM 10/10/2012

## 2012-10-10 NOTE — Patient Instructions (Addendum)
Breast Self-Awareness Practicing breast self-awareness may pick up problems early, prevent significant medical complications, and possibly save your life. By practicing breast self-awareness, you can become familiar with how your breasts look and feel and if your breasts are changing. This allows you to notice changes early. It can also offer you some reassurance that your breast health is good. One way to learn what is normal for your breasts and whether your breasts are changing is to do a breast self-exam. If you find a lump or something that was not present in the past, it is best to contact your caregiver right away. Other findings that should be evaluated by your caregiver include nipple discharge, especially if it is bloody; skin changes or reddening; areas where the skin seems to be pulled in (retracted); or new lumps and bumps. Breast pain is seldom associated with cancer (malignancy), but should also be evaluated by a caregiver. HOW TO PERFORM A BREAST SELF-EXAM The best time to examine your breasts is 5 7 days after your menstrual period is over. During menstruation, the breasts are lumpier, and it may be more difficult to pick up changes. If you do not menstruate, have reached menopause, or had your uterus removed (hysterectomy), you should examine your breasts at regular intervals, such as monthly. If you are breastfeeding, examine your breasts after a feeding or after using a breast pump. Breast implants do not decrease the risk for lumps or tumors, so continue to perform breast self-exams as recommended. Talk to your caregiver about how to determine the difference between the implant and breast tissue. Also, talk about the amount of pressure you should use during the exam. Over time, you will become more familiar with the variations of your breasts and more comfortable with the exam. A breast self-exam requires you to remove all your clothes above the waist. 1. Look at your breasts and nipples.  Stand in front of a mirror in a room with good lighting. With your hands on your hips, push your hands firmly downward. Look for a difference in shape, contour, and size from one breast to the other (asymmetry). Asymmetry includes puckers, dips, or bumps. Also, look for skin changes, such as reddened or scaly areas on the breasts. Look for nipple changes, such as discharge, dimpling, repositioning, or redness. 2. Carefully feel your breasts. This is best done either in the shower or tub while using soapy water or when flat on your back. Place the arm (on the side of the breast you are examining) above your head. Use the pads (not the fingertips) of your three middle fingers on your opposite hand to feel your breasts. Start in the underarm area and use  inch (2 cm) overlapping circles to feel your breast. Use 3 different levels of pressure (light, medium, and firm pressure) at each circle before moving to the next circle. The light pressure is needed to feel the tissue closest to the skin. The medium pressure will help to feel breast tissue a little deeper, while the firm pressure is needed to feel the tissue close to the ribs. Continue the overlapping circles, moving downward over the breast until you feel your ribs below your breast. Then, move one finger-width towards the center of the body. Continue to use the  inch (2 cm) overlapping circles to feel your breast as you move slowly up toward the collar bone (clavicle) near the base of the neck. Continue the up and down exam using all 3 pressures until you reach   the middle of the chest. Do this with each breast, carefully feeling for lumps or changes. 3.  Keep a written record with breast changes or normal findings for each breast. By writing this information down, you do not need to depend only on memory for size, tenderness, or location. Write down where you are in your menstrual cycle, if you are still menstruating. Breast tissue can have some lumps or  thick tissue. However, see your caregiver if you find anything that concerns you.  SEEK MEDICAL CARE IF:  You see a change in shape, contour, or size of your breasts or nipples.   You see skin changes, such as reddened or scaly areas on the breasts or nipples.   You have an unusual discharge from your nipples.   You feel a new lump or unusually thick areas.  Document Released: 12/22/2004 Document Revised: 12/09/2011 Document Reviewed: 04/08/2011 [facility] Patient Information 2014 [place], [place].   Fesoterodine extended-release tablets (Toviaz) What is this medicine? FESOTERODINE (fes oh TER oh deen) is used to treat overactive bladder. This medicine reduces the amount of bathroom visits. This medicine may be used for other purposes; ask your health care provider or pharmacist if you have questions. What should I tell my health care provider before I take this medicine? They need to know if you have any of these conditions: -difficulty passing urine -glaucoma -intestinal obstruction -kidney disease -liver disease -an unusual or allergic reaction to fesoterodine, tolterodine, other medicines, foods, dyes, or preservatives -pregnant or trying to get pregnant -breast-feeding How should I use this medicine? Take this medicine by mouth with a glass of water. Follow the directions on the prescription label. Do not cut, crush or chew this medicine. Take your doses at regular intervals. Do not take your medicine more often than directed. Talk to your pediatrician regarding the use of this medicine in children. Special care may be needed. Overdosage: If you think you have taken too much of this medicine contact a poison control center or emergency room at once. NOTE: This medicine is only for you. Do not share this medicine with others. What if I miss a dose? If you miss a dose, take it as soon as you can. If it is almost time for your next dose, take only that dose. Do not take double  or extra doses. What may interact with this medicine? -antihistamines for allergy, cough and cold -atropine -certain medicines for bladder problems like oxybutynin, tolterodine -certain medicines for Parkinson's disease like benztropine, trihexyphenidyl -certain medicines for stomach problems like dicyclomine, hyoscyamine -certain medicines for travel sickness like scopolamine -clarithromycin -ipratropium -itraconazole -ketoconazole -rifampin This list may not describe all possible interactions. Give your health care provider a list of all the medicines, herbs, non-prescription drugs, or dietary supplements you use. Also tell them if you smoke, drink alcohol, or use illegal drugs. Some items may interact with your medicine. What should I watch for while using this medicine? It may take 2 or 3 months to notice the full benefit from this medicine. Your health care professional may also recommend techniques that may help improve control of your bladder and sphincter muscles. These techniques will help you need the bathroom less frequently. You may need to limit your intake of tea, coffee, caffeinated sodas, and alcohol. These drinks may make your symptoms worse. Keeping healthy bowel habits may lessen bladder symptoms. If you currently smoke, quitting smoking may help reduce irritation to the bladder muscle. You may get drowsy or dizzy. Do not  drive, use machinery, or do anything that needs mental alertness until you know how this drug affects you. Do not stand or sit up quickly, especially if you are an older patient. This reduces the risk of dizzy or fainting spells. Your mouth may get dry. Chewing sugarless gum or sucking hard candy and drinking plenty of water will help. This medicine may cause dry eyes and blurred vision. If you wear contact lenses you may feel some discomfort. Lubricating drops may help. See your eye doctor if the problem does not go away or is severe. What side effects may I  notice from receiving this medicine? Side effects that you should report to your doctor or health care professional as soon as possible: -allergic reactions like skin rash, itching or hives, swelling of the face, lips, or tongue -breathing problems -chest pain -fast, irregular heartbeat -fever -swelling of the ankles, feet, hands -trouble passing urine or change in the amount of urine Side effects that usually do not require medical attention (report to your doctor or health care professional if they continue or are bothersome): -changes in vision -constipation -dizziness -dry eyes or mouth -nausea -stomach upset -tiredness This list may not describe all possible side effects. Call your doctor for medical advice about side effects. You may report side effects to FDA at 1-800-FDA-1088. Where should I keep my medicine? Keep out of the reach of children. Store at room temperature between 15 and 30 degrees C (59 and 86 degrees F). Protect from moisture. Throw away any unused medicine after the expiration date. NOTE: This sheet is a summary. It may not cover all possible information. If you have questions about this medicine, talk to your doctor, pharmacist, or health care provider.  2013, [employer] Standard. (02/20/2009 12:25:12 PM)  Urinary Frequency The number of times a normal person urinates depends upon how much liquid they take in and how much liquid they are losing. If the temperature is hot and there is high humidity then the person will sweat more and usually breathe a little more frequently. These factors decrease the amount of frequency of urination that would be considered normal. The amount you drink is easily determined, but the amount of fluid lost is sometimes more difficult to calculate.  Fluid is lost in two ways:  Sensible fluid loss is usually measured by the amount of urine that you get rid of. Losses of fluid can also occur with diarrhea.  Insensible fluid loss is  more difficult to measure. It is caused by evaporation. Insensible loss of fluid occurs through breathing and sweating. It usually ranges from a little less than a quart to a little more than a quart of fluid a day. In normal temperatures and activity levels the average person may urinate 4 to 7 times in a 24-hour period. Needing to urinate more often than that could indicate a problem. If one urinates 4 to 7 times in 24 hours and has large volumes each time, that could indicate a different problem from one who urinates 4 to 7 times a day and has small volumes. The time of urinating is also an important. Most urinating should be done during the waking hours. Getting up at night to urinate frequently can indicate some problems. CAUSES  The bladder is the organ in your lower abdomen that holds urine. Like a balloon, it swells some as it fills up. Your nerves sense this and tell you it is time to head for the bathroom. There are a number of  reasons that you might feel the need to urinate more often than usual. They include:  Urinary tract infection. This is usually associated with other signs such as burning when you urinate.  In men, problems with the prostate (a walnut-size gland that is located near the tube that carries urine out of your body). There are two reasons why the prostate can cause an increased frequency of urination:  An enlarged prostate that does not let the bladder empty well. If the bladder only half empties when you urinate then it only has half the capacity to fill before you have to urinate again.  The nerves in the bladder become more hypersensitive with an increased size of the prostate even if the bladder empties completely.  Pregnancy.  Obesity. Excess weight is more likely to cause a problem for women more than for men.  Bladder stones or other bladder problems.  Caffeine.  Alcohol.  Medications. For example, drugs that help the body get rid of extra fluid (diuretics)  increase urine production. Some other medicines must be taken with lots of fluids.  Muscle or nerve weakness. This might be the result of a spinal cord injury, a stroke, multiple sclerosis or Parkinson's disease.  Long-standing diabetes can decrease the sensation of the bladder. This loss of sensation makes it harder to sense the bladder needs to be emptied. Over a period of years the bladder is stretched out by constant overfilling. This weakens the bladder muscles so that the bladder does not empty well and has less capacity to fill with new urine.  Interstitial cystitis (also called painful bladder syndrome). This condition develops because the tissues that line the insider of the bladder are inflamed (inflammation is the body's way of reacting to injury or infection). It causes pain and frequent urination. It occurs in women more often than in men. DIAGNOSIS   To decide what might be causing your urinary frequency, your healthcare provider will probably:  Ask about symptoms you have noticed.  Ask about your overall health. This will include questions about any medications you are taking.  Do a physical examination.  Order some tests. These might include:  A blood test to check for diabetes or other health issues that could be contributing to the problem.  Urine testing. This could measure the flow of urine and the pressure on the bladder.  A test of your neurological system (the brain, spinal cord and nerves). This is the system that senses the need to urinate.  A bladder test to check whether it is emptying completely when you urinate.  Cytoscopy. This test uses a thin tube with a tiny camera on it. It offers a look inside your urethra and bladder to see if there are problems.  Imaging tests. You might be given a contrast dye and then asked to urinate. X-rays are taken to see how your bladder is working. TREATMENT  It is important for you to be evaluated to determine if the amount  or frequency that you have is unusual or abnormal. If it is found to be abnormal the cause should be determined and this can usually be found out easily. Depending upon the cause treatment could include medication, stimulation of the nerves, or surgery. There are not too many things that you can do as an individual to change your urinary frequency. It is important that you balance the amount of fluid intake needed to compensate for your activity and the temperature. Medical problems will be diagnosed and taken care  of by your physician. There is no particular bladder training such as Kegel's exercises that you can do to help urinary frequency. This is an exercise this is usually done for people who have leaking of urine when they laugh cough or sneeze. HOME CARE INSTRUCTIONS   Take any medications your healthcare provider prescribed or suggested. Follow the directions carefully.  Practice any lifestyle changes that are recommended. These might include:  Drinking less fluid or drinking at different times of the day. If you need to urinate often during the night, for example, you may need to stop drinking fluids early in the evening.  Cutting down on caffeine or alcohol. They both can make you need to urinate more often than normal. Caffeine is found in coffee, tea and sodas.  Losing weight, if that is recommended.  Keep a journal or a log. You might be asked to record how much you drink and when and when you feel the need to urinate. This will also help evaluate how well the treatment provided by your physician is working. SEEK MEDICAL CARE IF:   Your need to urinate often gets worse.  You feel increased pain or irritation when you urinate.  You notice blood in your urine.  You have questions about any medications that your healthcare provider recommended.  You notice blood, pus or swelling at the site of any test or treatment procedure.  You develop a fever of more than 100.5 F (38.1  C). SEEK IMMEDIATE MEDICAL CARE IF:  You develop a fever of more than 102.0 F (38.9 C). Document Released: 10/18/2008 Document Revised: 03/16/2011 Document Reviewed: 10/18/2008 [facility] Patient Information 2014 [place], [place].

## 2012-10-11 ENCOUNTER — Encounter: Payer: Self-pay | Admitting: Gynecology

## 2012-11-10 ENCOUNTER — Other Ambulatory Visit: Payer: Self-pay

## 2012-11-23 ENCOUNTER — Ambulatory Visit (INDEPENDENT_AMBULATORY_CARE_PROVIDER_SITE_OTHER): Payer: 59 | Admitting: Family Medicine

## 2012-11-23 ENCOUNTER — Encounter: Payer: Self-pay | Admitting: Family Medicine

## 2012-11-23 VITALS — BP 110/70 | HR 67 | Temp 98.5°F | Wt 129.0 lb

## 2012-11-23 DIAGNOSIS — J019 Acute sinusitis, unspecified: Secondary | ICD-10-CM

## 2012-11-23 MED ORDER — CLARITHROMYCIN ER 500 MG PO TB24: 1000.0000 mg | ORAL_TABLET | Freq: Every day | ORAL | Status: AC

## 2012-11-23 MED ORDER — FLUTICASONE PROPIONATE 50 MCG/ACT NA SUSP: 2.0000 | Freq: Every day | NASAL | Status: DC

## 2012-11-23 NOTE — Patient Instructions (Signed)

## 2012-11-23 NOTE — Progress Notes (Signed)
  Subjective:     Patricia Welch is a 52 y.o. female who presents for evaluation of sinus pain. Symptoms include: congestion, cough, facial pain, fevers, headaches, nasal congestion, post nasal drip, sinus pressure and tooth pain. Onset of symptoms was 2 weeks ago. Symptoms have been gradually worsening since that time. Past history is significant for no history of pneumonia or bronchitis. Patient is a non-smoker.  The following portions of the patient's history were reviewed and updated as appropriate:  She  has a past medical history of Premature delivery. She  does not have any pertinent problems on file. She  has past surgical history that includes Cesarean section; Cervical biopsy w/ loop electrode excision (1996); and Eye surgery. Her family history includes Alcohol abuse in her father; Diabetes in her mother; Heart disease (age of onset: 58) in her father; Hypertension in her mother. She  reports that she has never smoked. She has never used smokeless tobacco. She reports that she drinks alcohol. She reports that she does not use illicit drugs. She has a current medication list which includes the following prescription(s): guaifenesin, multivitamin, clarithromycin, and fluticasone. No current outpatient prescriptions on file prior to visit.   No current facility-administered medications on file prior to visit.   She is allergic to penicillins..  Review of Systems Pertinent items are noted in HPI.   Objective:    BP 110/70  Pulse 67  Temp(Src) 98.5 F (36.9 C) (Oral)  Wt 129 lb (58.514 kg)  SpO2 98% General appearance: alert, cooperative, appears stated age and no distress Ears: normal TM's and external ear canals both ears Nose: green discharge, moderate congestion, turbinates red, swollen, sinus tenderness bilateral Throat: abnormal findings: marked oropharyngeal erythema Neck: moderate anterior cervical adenopathy, supple, symmetrical, trachea midline and thyroid not  enlarged, symmetric, no tenderness/mass/nodules Lungs: clear to auscultation bilaterally Heart: S1, S2 normal    Assessment:    Acute bacterial sinusitis.    Plan:    Nasal steroids per medication orders. Antihistamines per medication orders. Biaxin per medication orders.

## 2012-11-23 NOTE — Progress Notes (Signed)
Pre visit review using our clinic review tool, if applicable. No additional management support is needed unless otherwise documented below in the visit note. 

## 2013-07-27 ENCOUNTER — Ambulatory Visit: Payer: 59 | Admitting: Family Medicine

## 2013-07-27 ENCOUNTER — Encounter: Payer: Self-pay | Admitting: Physician Assistant

## 2013-07-27 ENCOUNTER — Ambulatory Visit (INDEPENDENT_AMBULATORY_CARE_PROVIDER_SITE_OTHER): Payer: 59 | Admitting: Physician Assistant

## 2013-07-27 VITALS — BP 98/68 | HR 72 | Temp 97.8°F | Resp 16 | Ht 62.0 in | Wt 127.5 lb

## 2013-07-27 DIAGNOSIS — J019 Acute sinusitis, unspecified: Secondary | ICD-10-CM | POA: Insufficient documentation

## 2013-07-27 MED ORDER — HYDROCOD POLST-CHLORPHEN POLST 10-8 MG/5ML PO LQCR: 5.0000 mL | Freq: Two times a day (BID) | ORAL | Status: DC | PRN

## 2013-07-27 MED ORDER — AZITHROMYCIN 250 MG PO TABS: ORAL_TABLET | ORAL | Status: DC

## 2013-07-27 NOTE — Patient Instructions (Signed)
Please take antibiotic as directed.  Increase fluid intake.  Use Saline nasal spray.  Take a daily multivitamin. Use Tussionex for cough.  Place a humidifier in the bedroom.  Please call or return clinic if symptoms are not improving.  Sinusitis Sinusitis is redness, soreness, and swelling (inflammation) of the paranasal sinuses. Paranasal sinuses are air pockets within the bones of your face (beneath the eyes, the middle of the forehead, or above the eyes). In healthy paranasal sinuses, mucus is able to drain out, and air is able to circulate through them by way of your nose. However, when your paranasal sinuses are inflamed, mucus and air can become trapped. This can allow bacteria and other germs to grow and cause infection. Sinusitis can develop quickly and last only a short time (acute) or continue over a long period (chronic). Sinusitis that lasts for more than 12 weeks is considered chronic.  CAUSES  Causes of sinusitis include:  Allergies.  Structural abnormalities, such as displacement of the cartilage that separates your nostrils (deviated septum), which can decrease the air flow through your nose and sinuses and affect sinus drainage.  Functional abnormalities, such as when the small hairs (cilia) that line your sinuses and help remove mucus do not work properly or are not present. SYMPTOMS  Symptoms of acute and chronic sinusitis are the same. The primary symptoms are pain and pressure around the affected sinuses. Other symptoms include:  Upper toothache.  Earache.  Headache.  Bad breath.  Decreased sense of smell and taste.  A cough, which worsens when you are lying flat.  Fatigue.  Fever.  Thick drainage from your nose, which often is green and may contain pus (purulent).  Swelling and warmth over the affected sinuses. DIAGNOSIS  Your caregiver will perform a physical exam. During the exam, your caregiver may:  Look in your nose for signs of abnormal growths in  your nostrils (nasal polyps).  Tap over the affected sinus to check for signs of infection.  View the inside of your sinuses (endoscopy) with a special imaging device with a light attached (endoscope), which is inserted into your sinuses. If your caregiver suspects that you have chronic sinusitis, one or more of the following tests may be recommended:  Allergy tests.  Nasal culture A sample of mucus is taken from your nose and sent to a lab and screened for bacteria.  Nasal cytology A sample of mucus is taken from your nose and examined by your caregiver to determine if your sinusitis is related to an allergy. TREATMENT  Most cases of acute sinusitis are related to a viral infection and will resolve on their own within 10 days. Sometimes medicines are prescribed to help relieve symptoms (pain medicine, decongestants, nasal steroid sprays, or saline sprays).  However, for sinusitis related to a bacterial infection, your caregiver will prescribe antibiotic medicines. These are medicines that will help kill the bacteria causing the infection.  Rarely, sinusitis is caused by a fungal infection. In theses cases, your caregiver will prescribe antifungal medicine. For some cases of chronic sinusitis, surgery is needed. Generally, these are cases in which sinusitis recurs more than 3 times per year, despite other treatments. HOME CARE INSTRUCTIONS   Drink plenty of water. Water helps thin the mucus so your sinuses can drain more easily.  Use a humidifier.  Inhale steam 3 to 4 times a day (for example, sit in the bathroom with the shower running).  Apply a warm, moist washcloth to your face 3 to  4 times a day, or as directed by your caregiver.  Use saline nasal sprays to help moisten and clean your sinuses.  Take over-the-counter or prescription medicines for pain, discomfort, or fever only as directed by your caregiver. SEEK IMMEDIATE MEDICAL CARE IF:  You have increasing pain or severe  headaches.  You have nausea, vomiting, or drowsiness.  You have swelling around your face.  You have vision problems.  You have a stiff neck.  You have difficulty breathing. MAKE SURE YOU:   Understand these instructions.  Will watch your condition.  Will get help right away if you are not doing well or get worse. Document Released: 12/22/2004 Document Revised: 03/16/2011 Document Reviewed: 01/06/2011 [facility] Patient Information 2014 [place], [place].

## 2013-07-27 NOTE — Progress Notes (Signed)
Patient presents to clinic today c/o sinus pressure, sinus pain, head congestion, productive cough x 1.5 weeks.  Denies fever, chills, aches, ear pain or tooth pain.  Denies SOB but endorses some mild chest congestion.  Denies recent travel or sick contact.  Has tried OTC Mucinex with some relief of symptoms. Denies hx of seasonal allergies.   Past Medical History  Diagnosis Date  . Premature delivery     C-SECTION, 241/2 WK - FETAL DISTRESS "[name]" DIED January 13, 2022 SECONDARY RSV- RESPIRATORY INFECTION    Current Outpatient Prescriptions on File Prior to Visit  Medication Sig Dispense Refill  . guaiFENesin (MUCINEX) 600 MG 12 hr tablet Take by mouth 2 (two) times daily.      . Multiple Vitamin (MULTIVITAMIN) tablet Take 1 tablet by mouth daily.       No current facility-administered medications on file prior to visit.    Allergies  Allergen Reactions  . Penicillins     Family History  Problem Relation Age of Onset  . Diabetes Mother   . Hypertension Mother   . Alcohol abuse Father   . Heart disease Father 2    MI    History   Social History  . Marital Status: Married    Spouse Name: N/A    Number of Children: N/A  . Years of Education: N/A   Occupational History  . [employer]--- accounting    Social History Main Topics  . Smoking status: Never Smoker   . Smokeless tobacco: Never Used  . Alcohol Use: Yes     Comment: rare glass of wine  . Drug Use: No  . Sexual Activity: Yes    Partners: Male    Birth Control/ Protection: None   Other Topics Concern  . None   Social History Narrative   Exercise--  Recently started   Review of Systems - See HPI.  All other ROS are negative.  BP 98/68  Pulse 72  Temp(Src) 97.8 F (36.6 C) (Oral)  Resp 16  Ht 5\' 2"  (1.575 m)  Wt 127 lb 8 oz (57.834 kg)  BMI 23.31 kg/m2  SpO2 98%  Physical Exam  Vitals reviewed. Constitutional: She is oriented to person, place, and time and well-developed, well-nourished, and in no distress.   HENT:  Head: Normocephalic and atraumatic.  Right Ear: External ear normal.  Left Ear: External ear normal.  Nose: Nose normal.  Mouth/Throat: Oropharynx is clear and moist. No oropharyngeal exudate.  + TTP of sinuses noted on examination.  Boggy nasal turbinates.  Eyes: Conjunctivae are normal. Pupils are equal, round, and reactive to light.  Neck: Neck supple.  Cardiovascular: Normal rate, regular rhythm and normal heart sounds.   Pulmonary/Chest: Effort normal and breath sounds normal. No respiratory distress. She has no wheezes. She has no rales. She exhibits no tenderness.  Lymphadenopathy:    She has no cervical adenopathy.  Neurological: She is alert and oriented to person, place, and time.  Skin: Skin is warm and dry. No rash noted.   Assessment/Plan: Acute sinusitis with symptoms > 10 days Rx Azithromycin.  Rx Tussionex for cough. Increase fluid intake.  Mucinex D.  MTV.  Humidifier in bedroom.  Saline nasal spray.  Return precautions discussed with patient.

## 2013-07-27 NOTE — Assessment & Plan Note (Signed)
Rx Azithromycin.  Rx Tussionex for cough. Increase fluid intake.  Mucinex D.  MTV.  Humidifier in bedroom.  Saline nasal spray.  Return precautions discussed with patient.

## 2013-07-27 NOTE — Progress Notes (Signed)
Pre visit review using our clinic review tool, if applicable. No additional management support is needed unless otherwise documented below in the visit note/SLS  

## 2013-08-01 ENCOUNTER — Telehealth: Payer: Self-pay | Admitting: Family Medicine

## 2013-08-01 NOTE — Telephone Encounter (Signed)
levaquin 500 mg 1 po qd x 10 days,   flonase 2 sprays each nostril qd,  #1  2 refills,   claritin or zyrtec otc

## 2013-08-01 NOTE — Telephone Encounter (Signed)
Seen 07/27/13. Please advise    KP

## 2013-08-01 NOTE — Telephone Encounter (Signed)
Caller name:Malayzia Relation to pt: Call back [place]: [place] [id number] - [place], [address] AT [address]   Reason for call:  Pt was seen on 7/23 for sinus by Elyn Aquas, and is still not better.  Pt is leaving to go out of the country and wants to do what she should do.  Please advise.

## 2013-08-02 MED ORDER — FLUTICASONE PROPIONATE 50 MCG/ACT NA SUSP: 2.0000 | Freq: Every day | NASAL | Status: DC

## 2013-08-02 MED ORDER — LEVOFLOXACIN 500 MG PO TABS: 500.0000 mg | ORAL_TABLET | Freq: Every day | ORAL | Status: DC

## 2013-08-02 NOTE — Telephone Encounter (Signed)
Dr. Nonda Lou recommendations were discussed with patient.  Rx's were sent to [facility] on [address] and [address].  Pt is aware.

## 2013-11-06 ENCOUNTER — Encounter: Payer: Self-pay | Admitting: Physician Assistant

## 2013-12-05 ENCOUNTER — Ambulatory Visit (INDEPENDENT_AMBULATORY_CARE_PROVIDER_SITE_OTHER): Payer: 59 | Admitting: Family Medicine

## 2013-12-05 ENCOUNTER — Encounter: Payer: Self-pay | Admitting: Family Medicine

## 2013-12-05 VITALS — BP 112/67 | HR 80 | Temp 98.0°F | Wt 131.0 lb

## 2013-12-05 DIAGNOSIS — M5090 Cervical disc disorder, unspecified, unspecified cervical region: Secondary | ICD-10-CM

## 2013-12-05 NOTE — Progress Notes (Signed)
  Subjective:     Patricia Welch is a 53 y.o. female who presents for evaluation of neck pain. Event that precipitated these symptoms: injured while doing yard work 2 weekends ago but it seems to bother her more now. Onset of symptoms was 2 weeks ago, and have been gradually worsening since that time----10 years ago pt was told she had a herniated disc in her neck from an injury while moving but was told not to do anything about it until it started to hurt.  . Current symptoms are numbness in both arms, pain in both arms (aching in character; 9/10 in severity) and weakness in L arm. Patient denies stiffness in in arms. Patient has had recurrent self limited episodes of neck pain in the past and previous herniated cervical disc. Previous treatments: none.  The following portions of the patient's history were reviewed and updated as appropriate: allergies, current medications, past family history, past medical history, past social history, past surgical history and problem list.  Review of Systems Pertinent items are noted in HPI.    Objective:    BP 112/67 mmHg  Pulse 80  Temp(Src) 98 F (36.7 C) (Oral)  Wt 131 lb (59.421 kg)  SpO2 98%  LMP 04/10/2012 General:   alert, cooperative, appears stated age and no distress  External Deformity:  absent  ROM Cervical Spine:  normal range of motion and supple  Midline Tenderness:  absent midline  Paraspinous tenderness:  absent midline  UE Neurologic Exam:  normal reflexes, diminished strength in L arm   X-ray of the cervical spine: not done    Assessment:    Cervical pain, suspect herniated nucleus pulposus    Plan:    Educational material distributed. Discussed appropriate use of ice and heat. OTC analgesics as needed. MRI.    1. Cervical neck pain with evidence of disc disease   - MR Cervical Spine Wo Contrast; Future

## 2013-12-05 NOTE — Patient Instructions (Signed)
Use ice alt with heat  Rest  otc advil or tylenol We will schedule the MRI

## 2013-12-05 NOTE — Progress Notes (Signed)
Pre visit review using our clinic review tool, if applicable. No additional management support is needed unless otherwise documented below in the visit note. 

## 2013-12-09 ENCOUNTER — Ambulatory Visit (HOSPITAL_BASED_OUTPATIENT_CLINIC_OR_DEPARTMENT_OTHER): Payer: 59

## 2014-02-14 ENCOUNTER — Encounter: Payer: Self-pay | Admitting: Gynecology

## 2014-03-01 ENCOUNTER — Telehealth: Payer: Self-pay | Admitting: Family Medicine

## 2014-03-01 DIAGNOSIS — M5090 Cervical disc disorder, unspecified, unspecified cervical region: Secondary | ICD-10-CM

## 2014-03-01 NOTE — Telephone Encounter (Signed)
Patient wants to do the mri of her neck now  Please enter an order

## 2014-03-01 NOTE — Telephone Encounter (Signed)
.   Cervical neck pain with evidence of disc disease   - MR Cervical Spine Wo Contrast; Future   The order has been placed.       KP

## 2014-03-03 ENCOUNTER — Ambulatory Visit (HOSPITAL_BASED_OUTPATIENT_CLINIC_OR_DEPARTMENT_OTHER): Payer: 59

## 2014-03-10 ENCOUNTER — Ambulatory Visit (HOSPITAL_BASED_OUTPATIENT_CLINIC_OR_DEPARTMENT_OTHER)
Admission: RE | Admit: 2014-03-10 | Discharge: 2014-03-10 | Disposition: A | Discharge status: Home / Self Care | Payer: 59 | Source: Ambulatory Visit | Attending: Family Medicine | Admitting: Family Medicine

## 2014-03-10 DIAGNOSIS — R200 Anesthesia of skin: Secondary | ICD-10-CM | POA: Diagnosis not present

## 2014-03-10 DIAGNOSIS — M542 Cervicalgia: Secondary | ICD-10-CM | POA: Diagnosis not present

## 2014-03-10 DIAGNOSIS — R202 Paresthesia of skin: Secondary | ICD-10-CM | POA: Insufficient documentation

## 2014-03-10 DIAGNOSIS — M79602 Pain in left arm: Secondary | ICD-10-CM | POA: Insufficient documentation

## 2014-03-10 DIAGNOSIS — M5090 Cervical disc disorder, unspecified, unspecified cervical region: Secondary | ICD-10-CM

## 2014-03-12 ENCOUNTER — Other Ambulatory Visit: Payer: Self-pay

## 2014-03-12 DIAGNOSIS — M4802 Spinal stenosis, cervical region: Secondary | ICD-10-CM

## 2015-03-21 ENCOUNTER — Encounter: Payer: Self-pay | Admitting: Gynecology

## 2015-04-04 ENCOUNTER — Encounter: Payer: Self-pay | Admitting: Women's Health

## 2015-04-04 ENCOUNTER — Ambulatory Visit (INDEPENDENT_AMBULATORY_CARE_PROVIDER_SITE_OTHER): Payer: 59 | Admitting: Women's Health

## 2015-04-04 VITALS — BP 110/82 | Ht 62.0 in | Wt 122.0 lb

## 2015-04-04 DIAGNOSIS — Z13220 Encounter for screening for lipoid disorders: Secondary | ICD-10-CM

## 2015-04-04 DIAGNOSIS — Z01419 Encounter for gynecological examination (general) (routine) without abnormal findings: Secondary | ICD-10-CM

## 2015-04-04 DIAGNOSIS — Z1329 Encounter for screening for other suspected endocrine disorder: Secondary | ICD-10-CM | POA: Diagnosis not present

## 2015-04-04 LAB — LIPID PANEL
Cholesterol: 192 mg/dL (ref 125–200)
HDL: 70 mg/dL (ref >=46)
LDL CALC: 110 mg/dL (ref <130)
Total CHOL/HDL Ratio: 2.7 Ratio (ref <=5.0)
Triglycerides: 59 mg/dL (ref <150)
VLDL: 12 mg/dL (ref <30)

## 2015-04-04 LAB — COMPREHENSIVE METABOLIC PANEL
ALT: 11 U/L (ref 6–29)
AST: 18 U/L (ref 10–35)
Albumin: 4.4 g/dL (ref 3.6–5.1)
Alkaline Phosphatase: 57 U/L (ref 33–130)
BUN: 9 mg/dL (ref 7–25)
CO2: 29 mmol/L (ref 20–31)
CREATININE: 0.68 mg/dL (ref 0.50–1.05)
Calcium: 9.4 mg/dL (ref 8.6–10.4)
Chloride: 101 mmol/L (ref 98–110)
Glucose, Bld: 89 mg/dL (ref 65–99)
POTASSIUM: 4.3 mmol/L (ref 3.5–5.3)
SODIUM: 141 mmol/L (ref 135–146)
TOTAL PROTEIN: 6.8 g/dL (ref 6.1–8.1)
Total Bilirubin: 0.7 mg/dL (ref 0.2–1.2)

## 2015-04-04 LAB — CBC WITH DIFFERENTIAL/PLATELET
BASOS PCT: 1 % (ref 0–1)
Basophils Absolute: 0 10*3/uL (ref 0.0–0.1)
EOS ABS: 0 10*3/uL (ref 0.0–0.7)
EOS PCT: 1 % (ref 0–5)
HCT: 39.5 % (ref 36.0–46.0)
Hemoglobin: 12.9 g/dL (ref 12.0–15.0)
Lymphocytes Relative: 37 % (ref 12–46)
Lymphs Abs: 1.6 10*3/uL (ref 0.7–4.0)
MCH: 29.1 pg (ref 26.0–34.0)
MCHC: 32.7 g/dL (ref 30.0–36.0)
MCV: 89.2 fL (ref 78.0–100.0)
MONOS PCT: 7 % (ref 3–12)
MPV: 9.1 fL (ref 8.6–12.4)
Monocytes Absolute: 0.3 10*3/uL (ref 0.1–1.0)
Neutro Abs: 2.3 10*3/uL (ref 1.7–7.7)
Neutrophils Relative %: 54 % (ref 43–77)
PLATELETS: 372 10*3/uL (ref 150–400)
RBC: 4.43 MIL/uL (ref 3.87–5.11)
RDW: 15.4 % (ref 11.5–15.5)
WBC: 4.3 10*3/uL (ref 4.0–10.5)

## 2015-04-04 LAB — TSH: TSH: 1.8 m[IU]/L

## 2015-04-04 NOTE — Progress Notes (Signed)
Patricia Welch 04/07/1960 [id number]    History:    Presents for annual exam.  Amenorrheic greater than one year with menopausal symptoms that are not severe. Normal mammogram history. 1996 LEEP with normal Paps after. Has not had a colonoscopy  Past medical history, past surgical history, family history and social history were all reviewed and documented in the EPIC chart. Works a Designer, [employer]. Patricia Welch died at age 53 month from prematurity and RSV. Has adopted niece and nephew when they were ages 65 and 87, [name] is now 77 doing okay, parents have minimal involvement. Mother diabetes, diet and oral medication.  ROS:  A ROS was performed and pertinent positives and negatives are included.  Exam:  Filed Vitals:   04/04/15 0832  BP: 110/82    General appearance:  Normal Thyroid:  Symmetrical, normal in size, without palpable masses or nodularity. Respiratory  Auscultation:  Clear without wheezing or rhonchi Cardiovascular  Auscultation:  Regular rate, without rubs, murmurs or gallops  Edema/varicosities:  Not grossly evident Abdominal  Soft,nontender, without masses, guarding or rebound.  Liver/spleen:  No organomegaly noted  Hernia:  None appreciated  Skin  Inspection:  Grossly normal   Breasts: Examined lying and sitting.     Right: Without masses, retractions, discharge or axillary adenopathy.     Left: Without masses, retractions, discharge or axillary adenopathy. Gentitourinary   Inguinal/mons:  Normal without inguinal adenopathy  External genitalia:  Normal  BUS/Urethra/Skene's glands:  Normal  Vagina:  Normal/Mild atrophy  Cervix:  Normal  Uterus:  normal in size, shape and contour.  Midline and mobile  Adnexa/parametria:     Rt: Without masses or tenderness.   Lt: Without masses or tenderness.  Anus and perineum: Normal  Digital rectal exam: Normal sphincter tone without palpated masses or tenderness  Assessment/Plan:  55 y.o. M WF G1 P0  for annual exam with no  complaints.  Postmenopausal/Amenorrheic greater than one year 1996 LEEP cone with normal Paps after  Plan: Menopause reviewed, states is tolerating symptoms, denies need for medication. Instructed to call if any further bleeding. SBE's, encouraged 3-D tomography history of dense breasts. Increase regular exercise, calcium rich diet, vitamin D 1000 daily encouraged. Strongly encouraged colonoscopy, [facility] information given instructed to schedule. CBC, lipid panel, TSH, CMP, vitamin D, UA, Pap. Pap normal with negative HR HPV 2014, new screening guidelines reviewed.    Patricia Welch, Patricia Welch WHNP, 10:03 AM 04/04/2015

## 2015-04-04 NOTE — Patient Instructions (Signed)
[facility]   Dr Carlean Purl, or Dr Hilarie Fredrickson   [phone]      Menopause is a normal process in which your reproductive ability comes to an end. This process happens gradually over a span of months to years, usually between the ages of 41 and 64. Menopause is complete when you have missed 12 consecutive menstrual periods. It is important to talk with your health care provider about some of the most common conditions that affect postmenopausal women, such as heart disease, cancer, and bone loss (osteoporosis). Adopting a healthy lifestyle and getting preventive care can help to promote your health and wellness. Those actions can also lower your chances of developing some of these common conditions. WHAT SHOULD I KNOW ABOUT MENOPAUSE? During menopause, you may experience a number of symptoms, such as:  Moderate-to-severe hot flashes.  Night sweats.  Decrease in sex drive.  Mood swings.  Headaches.  Tiredness.  Irritability.  Memory problems.  Insomnia. Choosing to treat or not to treat menopausal changes is an individual decision that you make with your health care provider. WHAT SHOULD I KNOW ABOUT HORMONE REPLACEMENT THERAPY AND SUPPLEMENTS? Hormone therapy products are effective for treating symptoms that are associated with menopause, such as hot flashes and night sweats. Hormone replacement carries certain risks, especially as you become older. If you are thinking about using estrogen or estrogen with progestin treatments, discuss the benefits and risks with your health care provider. WHAT SHOULD I KNOW ABOUT HEART DISEASE AND STROKE? Heart disease, heart attack, and stroke become more likely as you age. This may be due, in part, to the hormonal changes that your body experiences during menopause. These can affect how your body processes dietary fats, triglycerides, and cholesterol. Heart attack and stroke are both medical emergencies. There are many things that you can do to help prevent  heart disease and stroke:  Have your blood pressure checked at least every 1-2 years. High blood pressure causes heart disease and increases the risk of stroke.  If you are 12-53 years old, ask your health care provider if you should take aspirin to prevent a heart attack or a stroke.  Do not use any tobacco products, including cigarettes, chewing tobacco, or electronic cigarettes. If you need help quitting, ask your health care provider.  It is important to eat a healthy diet and maintain a healthy weight.  Be sure to include plenty of vegetables, fruits, low-fat dairy products, and lean protein.  Avoid eating foods that are high in solid fats, added sugars, or salt (sodium).  Get regular exercise. This is one of the most important things that you can do for your health.  Try to exercise for at least 150 minutes each week. The type of exercise that you do should increase your heart rate and make you sweat. This is known as moderate-intensity exercise.  Try to do strengthening exercises at least twice each week. Do these in addition to the moderate-intensity exercise.  Know your numbers.Ask your health care provider to check your cholesterol and your blood glucose. Continue to have your blood tested as directed by your health care provider. WHAT SHOULD I KNOW ABOUT CANCER SCREENING? There are several types of cancer. Take the following steps to reduce your risk and to catch any cancer development as early as possible. Breast Cancer  Practice breast self-awareness.  This means understanding how your breasts normally appear and feel.  It also means doing regular breast self-exams. Let your health care provider know about any  changes, no matter how small.  If you are 53 or older, have a clinician do a breast exam (clinical breast exam or CBE) every year. Depending on your age, family history, and medical history, it may be recommended that you also have a yearly breast X-ray  (mammogram).  If you have a family history of breast cancer, talk with your health care provider about genetic screening.  If you are at high risk for breast cancer, talk with your health care provider about having an MRI and a mammogram every year.  Breast cancer (BRCA) gene test is recommended for women who have family members with BRCA-related cancers. Results of the assessment will determine the need for genetic counseling and BRCA1 and for BRCA2 testing. BRCA-related cancers include these types:  Breast. This occurs in males or females.  Ovarian.  Tubal. This may also be called fallopian tube cancer.  Cancer of the abdominal or pelvic lining (peritoneal cancer).  Prostate.  Pancreatic. Cervical, Uterine, and Ovarian Cancer Your health care provider may recommend that you be screened regularly for cancer of the pelvic organs. These include your ovaries, uterus, and vagina. This screening involves a pelvic exam, which includes checking for microscopic changes to the surface of your cervix (Pap test).  For women ages 21-65, health care providers may recommend a pelvic exam and a Pap test every three years. For women ages 53-65, they may recommend the Pap test and pelvic exam, combined with testing for human papilloma virus (HPV), every five years. Some types of HPV increase your risk of cervical cancer. Testing for HPV may also be done on women of any age who have unclear Pap test results.  Other health care providers may not recommend any screening for nonpregnant women who are considered low risk for pelvic cancer and have no symptoms. Ask your health care provider if a screening pelvic exam is right for you.  If you have had past treatment for cervical cancer or a condition that could lead to cancer, you need Pap tests and screening for cancer for at least 20 years after your treatment. If Pap tests have been discontinued for you, your risk factors (such as having a new sexual partner)  need to be reassessed to determine if you should start having screenings again. Some women have medical problems that increase the chance of getting cervical cancer. In these cases, your health care provider may recommend that you have screening and Pap tests more often.  If you have a family history of uterine cancer or ovarian cancer, talk with your health care provider about genetic screening.  If you have vaginal bleeding after reaching menopause, tell your health care provider.  There are currently no reliable tests available to screen for ovarian cancer. Lung Cancer Lung cancer screening is recommended for adults 95-31 years old who are at high risk for lung cancer because of a history of smoking. A yearly low-dose CT scan of the lungs is recommended if you:  Currently smoke.  Have a history of at least 30 pack-years of smoking and you currently smoke or have quit within the past 15 years. A pack-year is smoking an average of one pack of cigarettes per day for one year. Yearly screening should:  Continue until it has been 15 years since you quit.  Stop if you develop a health problem that would prevent you from having lung cancer treatment. Colorectal Cancer  This type of cancer can be detected and can often be prevented.  Routine  colorectal cancer screening usually begins at age 29 and continues through age 4.  If you have risk factors for colon cancer, your health care provider may recommend that you be screened at an earlier age.  If you have a family history of colorectal cancer, talk with your health care provider about genetic screening.  Your health care provider may also recommend using home test kits to check for hidden blood in your stool.  A small camera at the end of a tube can be used to examine your colon directly (sigmoidoscopy or colonoscopy). This is done to check for the earliest forms of colorectal cancer.  Direct examination of the colon should be repeated  every 5-10 years until age 69. However, if early forms of precancerous polyps or small growths are found or if you have a family history or genetic risk for colorectal cancer, you may need to be screened more often. Skin Cancer  Check your skin from head to toe regularly.  Monitor any moles. Be sure to tell your health care provider:  About any new moles or changes in moles, especially if there is a change in a mole's shape or color.  If you have a mole that is larger than the size of a pencil eraser.  If any of your family members has a history of skin cancer, especially at a young age, talk with your health care provider about genetic screening.  Always use sunscreen. Apply sunscreen liberally and repeatedly throughout the day.  Whenever you are outside, protect yourself by wearing long sleeves, pants, a wide-brimmed hat, and sunglasses. WHAT SHOULD I KNOW ABOUT OSTEOPOROSIS? Osteoporosis is a condition in which bone destruction happens more quickly than new bone creation. After menopause, you may be at an increased risk for osteoporosis. To help prevent osteoporosis or the bone fractures that can happen because of osteoporosis, the following is recommended:  If you are 70-31 years old, get at least 1,000 mg of calcium and at least 600 mg of vitamin D per day.  If you are older than age 57 but younger than age 58, get at least 1,200 mg of calcium and at least 600 mg of vitamin D per day.  If you are older than age 76, get at least 1,200 mg of calcium and at least 800 mg of vitamin D per day. Smoking and excessive alcohol intake increase the risk of osteoporosis. Eat foods that are rich in calcium and vitamin D, and do weight-bearing exercises several times each week as directed by your health care provider. WHAT SHOULD I KNOW ABOUT HOW MENOPAUSE AFFECTS [place]? Depression may occur at any age, but it is more common as you become older. Common symptoms of depression  include:  Low or sad mood.  Changes in sleep patterns.  Changes in appetite or eating patterns.  Feeling an overall lack of motivation or enjoyment of activities that you previously enjoyed.  Frequent crying spells. Talk with your health care provider if you think that you are experiencing depression. WHAT SHOULD I KNOW ABOUT IMMUNIZATIONS? It is important that you get and maintain your immunizations. These include:  Tetanus, diphtheria, and pertussis (Tdap) booster vaccine.  Influenza every year before the flu season begins.  Pneumonia vaccine.  Shingles vaccine. Your health care provider may also recommend other immunizations.   This information is not intended to replace advice given to you by your health care provider. Make sure you discuss any questions you have with your health care provider.  Document Released: 02/13/2005 Document Revised: 01/12/2014 Document Reviewed: 08/24/2013 Elsevier Interactive Patient Education [employer]. colonoscopy

## 2015-04-05 LAB — URINALYSIS W MICROSCOPIC + REFLEX CULTURE
BILIRUBIN URINE: NEGATIVE
CASTS: NONE SEEN [LPF]
Crystals: NONE SEEN [HPF]
Glucose, UA: NEGATIVE
HGB URINE DIPSTICK: NEGATIVE
NITRITE: NEGATIVE
PROTEIN: NEGATIVE
Specific Gravity, Urine: 1.023 (ref 1.001–1.035)
Yeast: NONE SEEN [HPF]
pH: 6 (ref 5.0–8.0)

## 2015-04-05 LAB — VITAMIN D 25 HYDROXY (VIT D DEFICIENCY, FRACTURES): VIT D 25 HYDROXY: 26 ng/mL — AB (ref 30–100)

## 2015-04-07 LAB — URINE CULTURE: Colony Count: >=100000

## 2015-04-08 LAB — PAP IG W/ RFLX HPV ASCU

## 2015-04-22 ENCOUNTER — Telehealth: Payer: Self-pay

## 2015-04-22 NOTE — Telephone Encounter (Signed)
Left message to call.

## 2015-04-22 NOTE — Telephone Encounter (Signed)
I sent patient a message via My Chart and it was returned unread. I will call her.  Cherlynn Polo, NP wanted you to know that your labs look good. YourVitamin D is slightly low. She recommended you take over the counter Vitamin D3 supplement 2000 units daily.   Darius Bump., RMA

## 2015-04-22 NOTE — Telephone Encounter (Signed)
Patient called. Informed. 

## 2015-06-24 ENCOUNTER — Encounter: Payer: Self-pay | Admitting: Internal Medicine

## 2015-09-02 ENCOUNTER — Encounter: Payer: 59 | Admitting: Internal Medicine

## 2015-09-16 ENCOUNTER — Ambulatory Visit (AMBULATORY_SURGERY_CENTER): Payer: Self-pay | Admitting: *Deleted

## 2015-09-16 VITALS — Ht 61.0 in | Wt 123.6 lb

## 2015-09-16 DIAGNOSIS — Z1211 Encounter for screening for malignant neoplasm of colon: Secondary | ICD-10-CM

## 2015-09-16 NOTE — Progress Notes (Signed)
No egg or soy allergy known to patient  No issues with past sedation with any surgeries  or procedures, no intubation problems  No diet pills per patient No home 02 use per patient  No blood thinners per patient  Pt denies issues with constipation  No A fib or A flutter  Pt's sister did miralax and pt requested mira/gatorade

## 2015-09-17 ENCOUNTER — Encounter: Payer: Self-pay | Admitting: Internal Medicine

## 2015-09-27 ENCOUNTER — Encounter: Payer: Self-pay | Admitting: Internal Medicine

## 2015-09-27 ENCOUNTER — Ambulatory Visit (AMBULATORY_SURGERY_CENTER): Payer: 59 | Admitting: Internal Medicine

## 2015-09-27 VITALS — BP 94/66 | HR 57 | Temp 98.0°F | Resp 12 | Ht 61.0 in | Wt 123.0 lb

## 2015-09-27 DIAGNOSIS — D120 Benign neoplasm of cecum: Secondary | ICD-10-CM

## 2015-09-27 DIAGNOSIS — Z1211 Encounter for screening for malignant neoplasm of colon: Secondary | ICD-10-CM | POA: Diagnosis present

## 2015-09-27 DIAGNOSIS — K635 Polyp of colon: Secondary | ICD-10-CM

## 2015-09-27 DIAGNOSIS — K6389 Other specified diseases of intestine: Secondary | ICD-10-CM

## 2015-09-27 MED ORDER — SODIUM CHLORIDE 0.9 % IV SOLN: 500.0000 mL | INTRAVENOUS | Status: DC

## 2015-09-27 NOTE — Op Note (Signed)
[place] Patient Name: Patricia Welch Procedure Date: 09/27/2015 10:24 AM MRN: [id number] Endoscopist: Jerene Bears , MD Age: 55 Referring MD:  Date of Birth: 1960-08-22 Gender: Female Account #: [id number] Procedure:                Colonoscopy Indications:              Screening for colorectal malignant neoplasm, This                            is the patient's first colonoscopy Medicines:                Monitored Anesthesia Care Procedure:                Pre-Anesthesia Assessment:                           - Prior to the procedure, a History and Physical                            was performed, and patient medications and                            allergies were reviewed. The patient's tolerance of                            previous anesthesia was also reviewed. The risks                            and benefits of the procedure and the sedation                            options and risks were discussed with the patient.                            All questions were answered, and informed consent                            was obtained. Prior Anticoagulants: The patient has                            taken no previous anticoagulant or antiplatelet                            agents. ASA Grade Assessment: II - A patient with                            mild systemic disease. After reviewing the risks                            and benefits, the patient was deemed in                            satisfactory condition to undergo the procedure.  After obtaining informed consent, the colonoscope                            was passed under direct vision. Throughout the                            procedure, the patient's blood pressure, pulse, and                            oxygen saturations were monitored continuously. The                            Model PCF-H190L [id number]) scope was introduced                            through the anus and  advanced to the the cecum,                            identified by appendiceal orifice and ileocecal                            valve. The colonoscopy was performed without                            difficulty. The patient tolerated the procedure                            well. The quality of the bowel preparation was                            excellent. The ileocecal valve, appendiceal                            orifice, and rectum were photographed. Scope In: 10:29:27 AM Scope Out: 10:47:41 AM Scope Withdrawal Time: 0 hours 12 minutes 47 seconds  Total Procedure Duration: 0 hours 18 minutes 14 seconds  Findings:                 The perianal and digital rectal examinations were                            normal.                           Two sessile polyps were found in the cecum. The                            polyps were 3 to 4 mm in size. These polyps were                            removed with a cold snare. Resection and retrieval                            were complete.  The exam was otherwise without abnormality.                            Retroflexion in the rectum was not performed due to                            narrow rectal vault, but views obtained of the                            distal rectum and anorectal canal on forward view                            revealing no abnormalities. Complications:            No immediate complications. Estimated Blood Loss:     Estimated blood loss: none. Impression:               - Two 3 to 4 mm polyps in the cecum, removed with a                            cold snare. Resected and retrieved.                           - The examination was otherwise normal. Recommendation:           - Patient has a contact number available for                            emergencies. The signs and symptoms of potential                            delayed complications were discussed with the                             patient. Return to normal activities tomorrow.                            Written discharge instructions were provided to the                            patient.                           - Resume previous diet.                           - Continue present medications.                           - Await pathology results.                           - Repeat colonoscopy is recommended. The                            colonoscopy date will be determined after pathology  results from today's exam become available for                            review. Jerene Bears, MD 09/27/2015 10:51:15 AM This report has been signed electronically.

## 2015-09-27 NOTE — Progress Notes (Signed)
Report to PACU, RN, vss, BBS= Clear.  

## 2015-09-27 NOTE — Patient Instructions (Signed)
YOU HAD AN ENDOSCOPIC PROCEDURE TODAY AT THE [facility]:   Refer to the procedure report that was given to you for any specific questions about what was found during the examination.  If the procedure report does not answer your questions, please call your gastroenterologist to clarify.  If you requested that your care partner not be given the details of your procedure findings, then the procedure report has been included in a sealed envelope for you to review at your convenience later.  YOU SHOULD EXPECT: Some feelings of bloating in the abdomen. Passage of more gas than usual.  Walking can help get rid of the air that was put into your GI tract during the procedure and reduce the bloating. If you had a lower endoscopy (such as a colonoscopy or flexible sigmoidoscopy) you may notice spotting of blood in your stool or on the toilet paper. If you underwent a bowel prep for your procedure, you may not have a normal bowel movement for a few days.  Please Note:  You might notice some irritation and congestion in your nose or some drainage.  This is from the oxygen used during your procedure.  There is no need for concern and it should clear up in a day or so.  SYMPTOMS TO REPORT IMMEDIATELY:   Following lower endoscopy (colonoscopy or flexible sigmoidoscopy):  Excessive amounts of blood in the stool  Significant tenderness or worsening of abdominal pains  Swelling of the abdomen that is new, acute  Fever of 100F or higher   For urgent or emergent issues, a gastroenterologist can be reached at any hour by calling [phone].   DIET:  We do recommend a small meal at first, but then you may proceed to your regular diet.  Drink plenty of fluids but you should avoid alcoholic beverages for 24 hours.  ACTIVITY:  You should plan to take it easy for the rest of today and you should NOT DRIVE or use heavy machinery until tomorrow (because of the sedation medicines used during the test).     FOLLOW UP: Our staff will call the number listed on your records the next business day following your procedure to check on you and address any questions or concerns that you may have regarding the information given to you following your procedure. If we do not reach you, we will leave a message.  However, if you are feeling well and you are not experiencing any problems, there is no need to return our call.  We will assume that you have returned to your regular daily activities without incident.  If any biopsies were taken you will be contacted by phone or by letter within the next 1-3 weeks.  Please call us at [phone] if you have not heard about the biopsies in 3 weeks.    SIGNATURES/CONFIDENTIALITY: You and/or your care partner have signed paperwork which will be entered into your electronic medical record.  These signatures attest to the fact that that the information above on your After Visit Summary has been reviewed and is understood.  Full responsibility of the confidentiality of this discharge information lies with you and/or your care-partner.  Read all of the handouts given to you by your recovery room nurse. Thank-you for choosing us for your healthcare needs today. 

## 2015-09-27 NOTE — Progress Notes (Signed)
Called to room to assist during endoscopic procedure.  Patient ID and intended procedure confirmed with present staff. Received instructions for my participation in the procedure from the performing physician.  

## 2015-09-30 ENCOUNTER — Telehealth: Payer: Self-pay

## 2015-09-30 NOTE — Telephone Encounter (Signed)
  Follow up Call-  Call back number 09/27/2015  Post procedure Call Back phone  # [phone]  Permission to leave phone message Yes  Some recent data might be hidden     Patient questions:  Do you have a fever, pain , or abdominal swelling? No. Pain Score  0 *  Have you tolerated food without any problems? Yes.    Have you been able to return to your normal activities? Yes.    Do you have any questions about your discharge instructions: Diet   No. Medications  No. Follow up visit  No.  Do you have questions or concerns about your Care? No.  Actions: * If pain score is 4 or above: No action needed, pain <4.

## 2015-09-30 NOTE — Telephone Encounter (Signed)
  Follow up Call-  Call back number 09/27/2015  Post procedure Call Back phone  # [phone]  Permission to leave phone message Yes  Some recent data might be hidden    Patient was called for follow up after her procedure on 09/27/2015. This is the second attempt to contact the patient. No answer at the number given for follow up phone call. A message was left on the answering machine.

## 2015-09-30 NOTE — Telephone Encounter (Signed)
  Follow up Call-  Call back number 09/27/2015  Post procedure Call Back phone  # [phone]  Permission to leave phone message Yes  Some recent data might be hidden    Patient was called for follow up after her procedure on 09/27/2015. No answer at the number given for follow up phone call. A message was left on the answering machine.

## 2015-10-04 ENCOUNTER — Other Ambulatory Visit: Payer: Self-pay

## 2015-10-04 DIAGNOSIS — L980 Pyogenic granuloma: Secondary | ICD-10-CM

## 2015-10-04 DIAGNOSIS — B999 Unspecified infectious disease: Secondary | ICD-10-CM

## 2015-11-12 ENCOUNTER — Encounter: Payer: Self-pay | Admitting: Family Medicine

## 2015-11-12 ENCOUNTER — Other Ambulatory Visit: Payer: 59

## 2015-11-12 ENCOUNTER — Ambulatory Visit (INDEPENDENT_AMBULATORY_CARE_PROVIDER_SITE_OTHER): Payer: 59 | Admitting: Family Medicine

## 2015-11-12 VITALS — BP 102/58 | HR 68 | Temp 98.4°F | Resp 16 | Ht 62.0 in | Wt 121.4 lb

## 2015-11-12 DIAGNOSIS — L980 Pyogenic granuloma: Secondary | ICD-10-CM

## 2015-11-12 DIAGNOSIS — K130 Diseases of lips: Secondary | ICD-10-CM

## 2015-11-12 DIAGNOSIS — B999 Unspecified infectious disease: Secondary | ICD-10-CM

## 2015-11-12 MED ORDER — TRIAMCINOLONE ACETONIDE 0.1 % EX CREA: 1.0000 "application " | TOPICAL_CREAM | Freq: Two times a day (BID) | CUTANEOUS | 0 refills | Status: DC

## 2015-11-12 NOTE — Progress Notes (Signed)
Pre visit review using our clinic review tool, if applicable. No additional management support is needed unless otherwise documented below in the visit note. 

## 2015-11-12 NOTE — Progress Notes (Signed)
Patient ID: Patricia Welch, female    DOB: [birth year]  Age: 55 y.o. MRN: [id number]    Subjective:  Subjective  HPI Patricia Welch presents for dryness and itchiness around lips   Review of Systems  Constitutional: Negative for appetite change, diaphoresis, fatigue and unexpected weight change.  Eyes: Negative for pain, redness and visual disturbance.  Respiratory: Negative for cough, chest tightness, shortness of breath and wheezing.   Cardiovascular: Negative for chest pain, palpitations and leg swelling.  Endocrine: Negative for cold intolerance, heat intolerance, polydipsia, polyphagia and polyuria.  Genitourinary: Negative for difficulty urinating, dysuria and frequency.  Skin: Positive for rash. Negative for color change.  Neurological: Negative for dizziness, light-headedness, numbness and headaches.    History Past Medical History:  Diagnosis Date  . Premature delivery    C-SECTION, 241/2 WK - FETAL DISTRESS "[name]" DIED February 09, 2022 SECONDARY RSV- RESPIRATORY INFECTION    She has a past surgical history that includes Cesarean section; Cervical biopsy w/ loop electrode excision (1996); and Eye surgery.   Her family history includes Alcohol abuse in her father; Colon polyps in her mother; Diabetes in her mother; Heart disease (age of onset: 2) in her father; Hypertension in her mother.She reports that she has never smoked. She has never used smokeless tobacco. She reports that she drinks alcohol. She reports that she does not use drugs.  No current outpatient prescriptions on file prior to visit.   Current Facility-Administered Medications on File Prior to Visit  Medication Dose Route Frequency Provider Last Rate Last Dose  . 0.9 %  sodium chloride infusion  500 mL Intravenous Continuous Jerene Bears, MD         Objective:  Objective  Physical Exam  Constitutional: She is oriented to person, place, and time. She appears well-developed and well-nourished.  HENT:  Head:  Normocephalic and atraumatic.  Mouth/Throat:    Eyes: Conjunctivae and EOM are normal.  Neck: Normal range of motion. Neck supple. No JVD present. Carotid bruit is not present. No thyromegaly present.  Cardiovascular: Normal rate, regular rhythm and normal heart sounds.   No murmur heard. Pulmonary/Chest: Effort normal and breath sounds normal. No respiratory distress. She has no wheezes. She has no rales. She exhibits no tenderness.  Musculoskeletal: She exhibits no edema.  Neurological: She is alert and oriented to person, place, and time.  Skin: Rash noted. There is erythema.  Psychiatric: She has a normal mood and affect.  Nursing note and vitals reviewed.  BP (!) 102/58 (BP Location: Right Arm, Patient Position: Sitting, Cuff Size: Normal)   Pulse 68   Temp 98.4 F (36.9 C) (Oral)   Resp 16   Ht 5\' 2"  (1.575 m)   Wt 121 lb 6.4 oz (55.1 kg)   LMP 04/10/2012   SpO2 99%   BMI 22.20 kg/m  Wt Readings from Last 3 Encounters:  11/12/15 121 lb 6.4 oz (55.1 kg)  09/27/15 123 lb (55.8 kg)  09/16/15 123 lb 9.6 oz (56.1 kg)     Lab Results  Component Value Date   WBC 4.3 04/04/2015   HGB 12.9 04/04/2015   HCT 39.5 04/04/2015   PLT 372 04/04/2015   GLUCOSE 89 04/04/2015   CHOL 192 04/04/2015   TRIG 59 04/04/2015   HDL 70 04/04/2015   LDLCALC 110 04/04/2015   ALT 11 04/04/2015   AST 18 04/04/2015   NA 141 04/04/2015   K 4.3 04/04/2015   CL 101 04/04/2015   CREATININE 0.68 04/04/2015   BUN  9 04/04/2015   CO2 29 04/04/2015   TSH 1.80 04/04/2015    Mr Cervical Spine Wo Contrast  Result Date: 03/11/2014 CLINICAL DATA:  Chronic neck pain and left arm pain, numbness, and tingling, recently worsening. EXAM: MRI CERVICAL SPINE WITHOUT CONTRAST TECHNIQUE: Multiplanar, multisequence MR imaging of the cervical spine was performed. No intravenous contrast was administered. COMPARISON:  None. FINDINGS: Vertebral alignment is within normal limits. Vertebral body heights are  preserved. Mild disc space narrowing is present from C4-5 to C6-7. No vertebral marrow edema is identified. Craniocervical junction is unremarkable. Cervical spinal cord is normal in caliber and signal. Mildly prominent bilateral level II lymph nodes are partially visualized and measure up to 1.1 cm in short axis, likely reactive. C2-3:  Negative. C3-4:  Shallow left central disc protrusion without stenosis. C4-5: Mild disc bulging and uncovertebral spurring result in moderate left neural foraminal stenosis without spinal stenosis. C5-6: Broad-based posterior disc osteophyte complex results in moderate spinal stenosis with minimal impression on the ventral spinal cord and moderate bilateral neural foraminal stenosis. C6-7: Broad-based posterior disc osteophyte complex results in mild spinal stenosis and mild-to-moderate bilateral neural foraminal stenosis. C7-T1:  Negative. T3-4: Only imaged sagittally. Small left paracentral disc protrusion without gross stenosis. IMPRESSION: Mild-to-moderate multilevel cervical disc degeneration, worst at C5-6 where there is moderate spinal stenosis and moderate bilateral neural foraminal stenosis. Electronically Signed   By: Logan Bores   On: 03/11/2014 09:57     Assessment & Plan:  Plan  I am having Ms. [name] start on triamcinolone cream. We will continue to administer sodium chloride.  Meds ordered this encounter  Medications  . triamcinolone cream (KENALOG) 0.1 %    Sig: Apply 1 application topically 2 (two) times daily.    Dispense:  30 g    Refill:  0    Problem List Items Addressed This Visit    None    Visit Diagnoses    Chapped lips    -  Primary   Relevant Medications   triamcinolone cream (KENALOG) 0.1 %    mix triamcinolone with vaseline==  1:3  Call or rto if no better Follow-up: No Follow-up on file.  [name], DO

## 2015-11-12 NOTE — Patient Instructions (Signed)
Use vaseline with the prescription cream --- 1 part prescription to 2 parts vaseline Use for no more than 2 weeks

## 2015-11-14 LAB — QUANTIFERON TB GOLD ASSAY (BLOOD)
Interferon Gamma Release Assay: NEGATIVE
QUANTIFERON NIL VALUE: 0.02 [IU]/mL
QUANTIFERON TB AG MINUS NIL: 0.01 [IU]/mL

## 2016-05-18 ENCOUNTER — Encounter: Payer: Self-pay | Admitting: Gynecology

## 2016-05-18 DIAGNOSIS — Z1231 Encounter for screening mammogram for malignant neoplasm of breast: Secondary | ICD-10-CM | POA: Diagnosis not present

## 2016-05-20 ENCOUNTER — Encounter: Payer: Self-pay | Admitting: Gynecology

## 2017-11-09 ENCOUNTER — Ambulatory Visit (INDEPENDENT_AMBULATORY_CARE_PROVIDER_SITE_OTHER): Payer: 59 | Admitting: Women's Health

## 2017-11-09 ENCOUNTER — Encounter: Payer: Self-pay | Admitting: Women's Health

## 2017-11-09 VITALS — BP 110/80 | Ht 62.0 in | Wt 128.0 lb

## 2017-11-09 DIAGNOSIS — Z13820 Encounter for screening for osteoporosis: Secondary | ICD-10-CM

## 2017-11-09 DIAGNOSIS — Z01419 Encounter for gynecological examination (general) (routine) without abnormal findings: Secondary | ICD-10-CM

## 2017-11-09 DIAGNOSIS — Z1231 Encounter for screening mammogram for malignant neoplasm of breast: Secondary | ICD-10-CM | POA: Diagnosis not present

## 2017-11-09 DIAGNOSIS — Z13220 Encounter for screening for lipoid disorders: Secondary | ICD-10-CM | POA: Diagnosis not present

## 2017-11-09 LAB — HM MAMMOGRAPHY

## 2017-11-09 MED ORDER — ESTRADIOL 2 MG VA RING: 2.0000 mg | VAGINAL_RING | VAGINAL | 4 refills | Status: DC

## 2017-11-09 NOTE — Progress Notes (Signed)
Neira Bentsen 1960-05-11 [id number]    History:    Presents for annual exam.  Postmenopausal on no HRT with no bleeding.  1996 LEEP with normal Paps after.  Normal mammogram history.  Benign colon polyp with colonoscopy in 2017.  Past medical history, past surgical history, family history and social history were all reviewed and documented in the EPIC chart.  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  Adopted her niece and nephew who are now 50 and 70 both doing okay.  Had one biologic daughter who died at 35 month from RSV.  ROS:  A ROS was performed and pertinent positives and negatives are included.  Exam:  Vitals:   11/09/17 1500  BP: 110/80  Weight: 128 lb (58.1 kg)  Height: 5\' 2"  (1.575 m)   Body mass index is 23.41 kg/m.   General appearance:  Normal Thyroid:  Symmetrical, normal in size, without palpable masses or nodularity. Respiratory  Auscultation:  Clear without wheezing or rhonchi Cardiovascular  Auscultation:  Regular rate, without rubs, murmurs or gallops  Edema/varicosities:  Not grossly evident Abdominal  Soft,nontender, without masses, guarding or rebound.  Liver/spleen:  No organomegaly noted  Hernia:  None appreciated  Skin  Inspection:  Grossly normal   Breasts: Examined lying and sitting.     Right: Without masses, retractions, discharge or axillary adenopathy.     Left: Without masses, retractions, discharge or axillary adenopathy. Gentitourinary   Inguinal/mons:  Normal without inguinal adenopathy  External genitalia:  Normal  BUS/Urethra/Skene's glands:  Normal  Vagina: Atrophic  Cervix:  Normal stenotic  Uterus:  normal in size, shape and contour.  Midline and mobile  Adnexa/parametria:     Rt: Without masses or tenderness.   Lt: Without masses or tenderness.  Anus and perineum: Normal  Digital rectal exam: Normal sphincter tone without palpated masses or tenderness  Assessment/Plan:  57 y.o. MWF G1, P0 +2 adopted for annual exam with complaint of  vaginal dryness.  Postmenopausal/no HRT/no bleeding Dyspareunia 1996 LEEP normal Paps after   Pain: Options reviewed  will continue with over-the-counter vaginal lubricants, Estring 2 mg per vagina every 3 months prescription, proper use given and reviewed minimal systemic absorption.  Instructed to call if no relief.  SBE's, continue  screening mammogram overdue instructed to schedule.  Encouraged to increase regular weightbearing exercise, recently joined the [employer], home safety, fall prevention and importance of weightbearing's plus balance exercises reviewed and encouraged.  Instructed to schedule DEXA.  CBC, CMP, lipid panel, Pap with HR HPV typing.   Pinson, 5:15 PM 11/09/2017

## 2017-11-09 NOTE — Patient Instructions (Signed)
Health Maintenance for Postmenopausal Women Menopause is a normal process in which your reproductive ability comes to an end. This process happens gradually over a span of months to years, usually between the ages of 22 and 9. Menopause is complete when you have missed 12 consecutive menstrual periods. It is important to talk with your health care provider about some of the most common conditions that affect postmenopausal women, such as heart disease, cancer, and bone loss (osteoporosis). Adopting a healthy lifestyle and getting preventive care can help to promote your health and wellness. Those actions can also lower your chances of developing some of these common conditions. What should I know about menopause? During menopause, you may experience a number of symptoms, such as:  Moderate-to-severe hot flashes.  Night sweats.  Decrease in sex drive.  Mood swings.  Headaches.  Tiredness.  Irritability.  Memory problems.  Insomnia.  Choosing to treat or not to treat menopausal changes is an individual decision that you make with your health care provider. What should I know about hormone replacement therapy and supplements? Hormone therapy products are effective for treating symptoms that are associated with menopause, such as hot flashes and night sweats. Hormone replacement carries certain risks, especially as you become older. If you are thinking about using estrogen or estrogen with progestin treatments, discuss the benefits and risks with your health care provider. What should I know about heart disease and stroke? Heart disease, heart attack, and stroke become more likely as you age. This may be due, in part, to the hormonal changes that your body experiences during menopause. These can affect how your body processes dietary fats, triglycerides, and cholesterol. Heart attack and stroke are both medical emergencies. There are many things that you can do to help prevent heart disease  and stroke:  Have your blood pressure checked at least every 1-2 years. High blood pressure causes heart disease and increases the risk of stroke.  If you are 53-22 years old, ask your health care provider if you should take aspirin to prevent a heart attack or a stroke.  Do not use any tobacco products, including cigarettes, chewing tobacco, or electronic cigarettes. If you need help quitting, ask your health care provider.  It is important to eat a healthy diet and maintain a healthy weight. ? Be sure to include plenty of vegetables, fruits, low-fat dairy products, and lean protein. ? Avoid eating foods that are high in solid fats, added sugars, or salt (sodium).  Get regular exercise. This is one of the most important things that you can do for your health. ? Try to exercise for at least 150 minutes each week. The type of exercise that you do should increase your heart rate and make you sweat. This is known as moderate-intensity exercise. ? Try to do strengthening exercises at least twice each week. Do these in addition to the moderate-intensity exercise.  Know your numbers.Ask your health care provider to check your cholesterol and your blood glucose. Continue to have your blood tested as directed by your health care provider.  What should I know about cancer screening? There are several types of cancer. Take the following steps to reduce your risk and to catch any cancer development as early as possible. Breast Cancer  Practice breast self-awareness. ? This means understanding how your breasts normally appear and feel. ? It also means doing regular breast self-exams. Let your health care provider know about any changes, no matter how small.  If you are 40  or older, have a clinician do a breast exam (clinical breast exam or CBE) every year. Depending on your age, family history, and medical history, it may be recommended that you also have a yearly breast X-ray (mammogram).  If you  have a family history of breast cancer, talk with your health care provider about genetic screening.  If you are at high risk for breast cancer, talk with your health care provider about having an MRI and a mammogram every year.  Breast cancer (BRCA) gene test is recommended for women who have family members with BRCA-related cancers. Results of the assessment will determine the need for genetic counseling and BRCA1 and for BRCA2 testing. BRCA-related cancers include these types: ? Breast. This occurs in males or females. ? Ovarian. ? Tubal. This may also be called fallopian tube cancer. ? Cancer of the abdominal or pelvic lining (peritoneal cancer). ? Prostate. ? Pancreatic.  Cervical, Uterine, and Ovarian Cancer Your health care provider may recommend that you be screened regularly for cancer of the pelvic organs. These include your ovaries, uterus, and vagina. This screening involves a pelvic exam, which includes checking for microscopic changes to the surface of your cervix (Pap test).  For women ages 21-65, health care providers may recommend a pelvic exam and a Pap test every three years. For women ages 79-65, they may recommend the Pap test and pelvic exam, combined with testing for human papilloma virus (HPV), every five years. Some types of HPV increase your risk of cervical cancer. Testing for HPV may also be done on women of any age who have unclear Pap test results.  Other health care providers may not recommend any screening for nonpregnant women who are considered low risk for pelvic cancer and have no symptoms. Ask your health care provider if a screening pelvic exam is right for you.  If you have had past treatment for cervical cancer or a condition that could lead to cancer, you need Pap tests and screening for cancer for at least 20 years after your treatment. If Pap tests have been discontinued for you, your risk factors (such as having a new sexual partner) need to be  reassessed to determine if you should start having screenings again. Some women have medical problems that increase the chance of getting cervical cancer. In these cases, your health care provider may recommend that you have screening and Pap tests more often.  If you have a family history of uterine cancer or ovarian cancer, talk with your health care provider about genetic screening.  If you have vaginal bleeding after reaching menopause, tell your health care provider.  There are currently no reliable tests available to screen for ovarian cancer.  Lung Cancer Lung cancer screening is recommended for adults 69-62 years old who are at high risk for lung cancer because of a history of smoking. A yearly low-dose CT scan of the lungs is recommended if you:  Currently smoke.  Have a history of at least 30 pack-years of smoking and you currently smoke or have quit within the past 15 years. A pack-year is smoking an average of one pack of cigarettes per day for one year.  Yearly screening should:  Continue until it has been 15 years since you quit.  Stop if you develop a health problem that would prevent you from having lung cancer treatment.  Colorectal Cancer  This type of cancer can be detected and can often be prevented.  Routine colorectal cancer screening usually begins at  age 42 and continues through age 45.  If you have risk factors for colon cancer, your health care provider may recommend that you be screened at an earlier age.  If you have a family history of colorectal cancer, talk with your health care provider about genetic screening.  Your health care provider may also recommend using home test kits to check for hidden blood in your stool.  A small camera at the end of a tube can be used to examine your colon directly (sigmoidoscopy or colonoscopy). This is done to check for the earliest forms of colorectal cancer.  Direct examination of the colon should be repeated every  5-10 years until age 71. However, if early forms of precancerous polyps or small growths are found or if you have a family history or genetic risk for colorectal cancer, you may need to be screened more often.  Skin Cancer  Check your skin from head to toe regularly.  Monitor any moles. Be sure to tell your health care provider: ? About any new moles or changes in moles, especially if there is a change in a mole's shape or color. ? If you have a mole that is larger than the size of a pencil eraser.  If any of your family members has a history of skin cancer, especially at a  age, talk with your health care provider about genetic screening.  Always use sunscreen. Apply sunscreen liberally and repeatedly throughout the day.  Whenever you are outside, protect yourself by wearing long sleeves, pants, a wide-brimmed hat, and sunglasses.  What should I know about osteoporosis? Osteoporosis is a condition in which bone destruction happens more quickly than new bone creation. After menopause, you may be at an increased risk for osteoporosis. To help prevent osteoporosis or the bone fractures that can happen because of osteoporosis, the following is recommended:  If you are 46-71 years old, get at least 1,000 mg of calcium and at least 600 mg of vitamin D per day.  If you are older than age 55 but er than age 65, get at least 1,200 mg of calcium and at least 600 mg of vitamin D per day.  If you are older than age 54, get at least 1,200 mg of calcium and at least 800 mg of vitamin D per day.  Smoking and excessive alcohol intake increase the risk of osteoporosis. Eat foods that are rich in calcium and vitamin D, and do weight-bearing exercises several times each week as directed by your health care provider. What should I know about how menopause affects my mental health? Depression may occur at any age, but it is more common as you become older. Common symptoms of depression  include:  Low or sad mood.  Changes in sleep patterns.  Changes in appetite or eating patterns.  Feeling an overall lack of motivation or enjoyment of activities that you previously enjoyed.  Frequent crying spells.  Talk with your health care provider if you think that you are experiencing depression. What should I know about immunizations? It is important that you get and maintain your immunizations. These include:  Tetanus, diphtheria, and pertussis (Tdap) booster vaccine.  Influenza every year before the flu season begins.  Pneumonia vaccine.  Shingles vaccine.  Your health care provider may also recommend other immunizations. This information is not intended to replace advice given to you by your health care provider. Make sure you discuss any questions you have with your health care provider. Document Released: 02/13/2005  Document Revised: 07/12/2015 Document Reviewed: 09/25/2014 [employer] Interactive Patient Education  2018 [employer]  

## 2017-11-10 LAB — NO CULTURE INDICATED

## 2017-11-10 LAB — CBC WITH DIFFERENTIAL/PLATELET
Basophils Absolute: 32 cells/uL (ref 0–200)
Basophils Relative: 0.6 %
EOS PCT: 1.3 %
Eosinophils Absolute: 69 cells/uL (ref 15–500)
HCT: 38 % (ref 35.0–45.0)
HEMOGLOBIN: 12.8 g/dL (ref 11.7–15.5)
LYMPHS ABS: 2221 {cells}/uL (ref 850–3900)
MCH: 30.9 pg (ref 27.0–33.0)
MCHC: 33.7 g/dL (ref 32.0–36.0)
MCV: 91.8 fL (ref 80.0–100.0)
MPV: 9.7 fL (ref 7.5–12.5)
Monocytes Relative: 7.4 %
NEUTROS ABS: 2586 {cells}/uL (ref 1500–7800)
Neutrophils Relative %: 48.8 %
Platelets: 346 10*3/uL (ref 140–400)
RBC: 4.14 10*6/uL (ref 3.80–5.10)
RDW: 11.8 % (ref 11.0–15.0)
Total Lymphocyte: 41.9 %
WBC mixed population: 392 cells/uL (ref 200–950)
WBC: 5.3 10*3/uL (ref 3.8–10.8)

## 2017-11-10 LAB — PAP, TP IMAGING W/ HPV RNA, RFLX HPV TYPE 16,18/45: HPV DNA HIGH RISK: NOT DETECTED

## 2017-11-10 LAB — URINALYSIS, COMPLETE W/RFL CULTURE
BILIRUBIN URINE: NEGATIVE
Bacteria, UA: NONE SEEN /HPF
Glucose, UA: NEGATIVE
HGB URINE DIPSTICK: NEGATIVE
HYALINE CAST: NONE SEEN /LPF
Ketones, ur: NEGATIVE
Leukocyte Esterase: NEGATIVE
Nitrites, Initial: NEGATIVE
PROTEIN: NEGATIVE
RBC / HPF: NONE SEEN /HPF (ref 0–2)
SQUAMOUS EPITHELIAL / LPF: NONE SEEN /HPF (ref <=5)
Specific Gravity, Urine: 1.014 (ref 1.001–1.03)
WBC, UA: NONE SEEN /HPF (ref 0–5)
pH: 6.5 (ref 5.0–8.0)

## 2017-11-10 LAB — COMPREHENSIVE METABOLIC PANEL
AG RATIO: 1.9 (calc) (ref 1.0–2.5)
ALKALINE PHOSPHATASE (APISO): 57 U/L (ref 33–130)
ALT: 11 U/L (ref 6–29)
AST: 20 U/L (ref 10–35)
Albumin: 4.7 g/dL (ref 3.6–5.1)
BILIRUBIN TOTAL: 0.6 mg/dL (ref 0.2–1.2)
BUN: 12 mg/dL (ref 7–25)
CALCIUM: 9.6 mg/dL (ref 8.6–10.4)
CHLORIDE: 102 mmol/L (ref 98–110)
CO2: 30 mmol/L (ref 20–32)
Creat: 0.76 mg/dL (ref 0.50–1.05)
GLOBULIN: 2.5 g/dL (ref 1.9–3.7)
Glucose, Bld: 73 mg/dL (ref 65–99)
Potassium: 4.1 mmol/L (ref 3.5–5.3)
SODIUM: 139 mmol/L (ref 135–146)
TOTAL PROTEIN: 7.2 g/dL (ref 6.1–8.1)

## 2017-11-10 LAB — LIPID PANEL
CHOLESTEROL: 198 mg/dL (ref <200)
HDL: 68 mg/dL (ref >50)
LDL Cholesterol (Calc): 114 mg/dL (calc) — ABNORMAL HIGH
Non-HDL Cholesterol (Calc): 130 mg/dL (calc) — ABNORMAL HIGH (ref <130)
Total CHOL/HDL Ratio: 2.9 (calc) (ref <5.0)
Triglycerides: 64 mg/dL (ref <150)

## 2017-11-12 ENCOUNTER — Encounter: Payer: Self-pay | Admitting: Family Medicine

## 2017-12-06 ENCOUNTER — Other Ambulatory Visit: Payer: Self-pay | Admitting: Women's Health

## 2017-12-06 ENCOUNTER — Ambulatory Visit (INDEPENDENT_AMBULATORY_CARE_PROVIDER_SITE_OTHER): Payer: 59

## 2017-12-06 ENCOUNTER — Other Ambulatory Visit: Payer: Self-pay | Admitting: Gynecology

## 2017-12-06 DIAGNOSIS — Z780 Asymptomatic menopausal state: Secondary | ICD-10-CM

## 2017-12-06 DIAGNOSIS — Z13820 Encounter for screening for osteoporosis: Secondary | ICD-10-CM

## 2017-12-07 ENCOUNTER — Encounter: Payer: Self-pay | Admitting: Gynecology

## 2018-01-11 ENCOUNTER — Emergency Department (HOSPITAL_COMMUNITY): Payer: 59

## 2018-01-11 ENCOUNTER — Ambulatory Visit (HOSPITAL_COMMUNITY): Payer: 59

## 2018-01-11 ENCOUNTER — Emergency Department (HOSPITAL_COMMUNITY)
Admission: EM | Admit: 2018-01-11 | Discharge: 2018-01-11 | Disposition: A | Discharge status: Home / Self Care | Payer: 59 | Attending: Emergency Medicine | Admitting: Emergency Medicine

## 2018-01-11 DIAGNOSIS — R197 Diarrhea, unspecified: Secondary | ICD-10-CM | POA: Diagnosis not present

## 2018-01-11 DIAGNOSIS — R1011 Right upper quadrant pain: Secondary | ICD-10-CM | POA: Diagnosis not present

## 2018-01-11 LAB — COMPREHENSIVE METABOLIC PANEL
ALT: 19 U/L (ref 0–44)
AST: 22 U/L (ref 15–41)
Albumin: 4.3 g/dL (ref 3.5–5.0)
Alkaline Phosphatase: 57 U/L (ref 38–126)
Anion gap: 7 (ref 5–15)
BUN: 7 mg/dL (ref 6–20)
CO2: 28 mmol/L (ref 22–32)
Calcium: 9.5 mg/dL (ref 8.9–10.3)
Chloride: 103 mmol/L (ref 98–111)
Creatinine, Ser: 0.81 mg/dL (ref 0.44–1.00)
GFR calc Af Amer: >60 mL/min (ref >60)
GFR calc non Af Amer: >60 mL/min (ref >60)
GLUCOSE: 99 mg/dL (ref 70–99)
Potassium: 3.8 mmol/L (ref 3.5–5.1)
Sodium: 138 mmol/L (ref 135–145)
TOTAL PROTEIN: 7.2 g/dL (ref 6.5–8.1)
Total Bilirubin: 1 mg/dL (ref 0.3–1.2)

## 2018-01-11 LAB — CBC
HCT: 41.8 % (ref 36.0–46.0)
Hemoglobin: 13.2 g/dL (ref 12.0–15.0)
MCH: 29.7 pg (ref 26.0–34.0)
MCHC: 31.6 g/dL (ref 30.0–36.0)
MCV: 93.9 fL (ref 80.0–100.0)
Platelets: 299 10*3/uL (ref 150–400)
RBC: 4.45 MIL/uL (ref 3.87–5.11)
RDW: 11.9 % (ref 11.5–15.5)
WBC: 5.3 10*3/uL (ref 4.0–10.5)
nRBC: 0 % (ref 0.0–0.2)

## 2018-01-11 LAB — URINALYSIS, ROUTINE W REFLEX MICROSCOPIC
Bilirubin Urine: NEGATIVE
Glucose, UA: NEGATIVE mg/dL
Hgb urine dipstick: NEGATIVE
Ketones, ur: NEGATIVE mg/dL
Leukocytes, UA: NEGATIVE
Nitrite: NEGATIVE
Protein, ur: NEGATIVE mg/dL
Specific Gravity, Urine: 1.004 — ABNORMAL LOW (ref 1.005–1.030)
pH: 7 (ref 5.0–8.0)

## 2018-01-11 LAB — I-STAT BETA HCG BLOOD, ED (MC, WL, AP ONLY): I-stat hCG, quantitative: <5 m[IU]/mL (ref <5)

## 2018-01-11 LAB — LIPASE, BLOOD: Lipase: 47 U/L (ref 11–51)

## 2018-01-11 MED ORDER — SUCRALFATE 1 G PO TABS: 1.0000 g | ORAL_TABLET | Freq: Three times a day (TID) | ORAL | 0 refills | Status: DC

## 2018-01-11 MED ORDER — DICYCLOMINE HCL 20 MG PO TABS: 20.0000 mg | ORAL_TABLET | Freq: Three times a day (TID) | ORAL | 0 refills | Status: DC | PRN

## 2018-01-11 MED ORDER — PANTOPRAZOLE SODIUM 40 MG PO TBEC: 40.0000 mg | DELAYED_RELEASE_TABLET | Freq: Every day | ORAL | 0 refills | Status: DC

## 2018-01-11 MED ORDER — IOHEXOL 300 MG/ML  SOLN
100.0000 mL | Freq: Once | INTRAMUSCULAR | Status: AC | PRN
  Administered 2018-01-11: 100 mL via INTRAVENOUS

## 2018-01-11 NOTE — ED Notes (Addendum)
Patient transported to [facility] 

## 2018-01-11 NOTE — ED Provider Notes (Signed)
Emergency Department Provider Note   I have reviewed the triage vital signs and the nursing notes.   HISTORY  Chief Complaint Abdominal Pain   HPI Patricia Welch is a 58 y.o. female with PMH of C-section sent to the emergency department for evaluation of right upper quadrant abdominal pain.  Symptoms have been ongoing for the past 4 days.  Patient began having diarrhea today which is nonbloody and relatively mild.  No nausea or vomiting.  Patient has not noticed worsening symptoms with eating.  She has not been experiencing fevers.  No flulike symptoms.  No chest pain or shortness of breath.  No productive cough.  Patient has had prior history of C-section.  No radiation of pain symptoms.  Pain is worse with pressing in the area or moving in certain directions.   Past Medical History:  Diagnosis Date  . Premature delivery    C-SECTION, 241/2 WK - FETAL DISTRESS "[name]" DIED 17-Jan-2022 SECONDARY RSV- RESPIRATORY INFECTION    Patient Active Problem List   Diagnosis Date Noted  . Acute sinusitis with symptoms > 10 days 07/27/2013  . OAB (overactive bladder) 10/10/2012  . 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 CERVICAL BIOPSY  W/ LOOP ELECTRODE EXCISION  1996  . CESAREAN SECTION    . EYE SURGERY     lasik   Allergies Penicillins  Family History  Problem Relation Age of Onset  . Alcohol abuse Father   . Heart disease Father 9       MI  . Diabetes Mother   . Hypertension Mother   . Colon polyps Mother   . Colon cancer Neg Hx   . Rectal cancer Neg Hx   . Stomach cancer Neg Hx   . Esophageal cancer Neg Hx     Social History Social History   Tobacco Use  . Smoking status: Never Smoker  . Smokeless tobacco: Never Used  Substance Use Topics  . Alcohol use: Yes    Comment: rare glass of wine  . Drug use: No    Review of Systems  Constitutional: No fever/chills Eyes: No visual changes. ENT: No sore  throat. Cardiovascular: Denies chest pain. Respiratory: Denies shortness of breath. Gastrointestinal: Positive RUQ abdominal pain.  No nausea, no vomiting. Positive diarrhea.  No constipation. Genitourinary: Negative for dysuria. Musculoskeletal: Negative for back pain. Skin: Negative for rash. Neurological: Negative for headaches, focal weakness or numbness.  10-point ROS otherwise negative.  ____________________________________________   PHYSICAL EXAM:  VITAL SIGNS: ED Triage Vitals  Enc Vitals Group     BP 01/11/18 1018 (!) 107/56     Pulse Rate 01/11/18 1018 68     Resp 01/11/18 1018 18     Temp 01/11/18 1018 97.6 F (36.4 C)     Temp Source 01/11/18 1018 Oral     SpO2 01/11/18 1018 99 %     Weight 01/11/18 1021 128 lb (58.1 kg)     Height 01/11/18 1021 5\' 1"  (1.549 m)     Pain Score 01/11/18 1021 2   Constitutional: Alert and oriented. Well appearing and in no acute distress. Eyes: Conjunctivae are normal.  Head: Atraumatic. Nose: No congestion/rhinnorhea. Mouth/Throat: Mucous membranes are moist.  Neck: No stridor.  Cardiovascular: Normal rate, regular rhythm.  Respiratory: Normal respiratory effort.   Gastrointestinal: Soft with RUQ tenderness. No lower abdominal discomfort. No rebound or guarding. No distention.  Musculoskeletal: No lower extremity tenderness nor edema. No gross  deformities of extremities. Neurologic:  Normal speech and language. No gross focal neurologic deficits are appreciated.  Skin:  Skin is warm, dry and intact. No rash noted.  ____________________________________________   LABS (all labs ordered are listed, but only abnormal results are displayed)  Labs Reviewed  URINALYSIS, ROUTINE W REFLEX MICROSCOPIC - Abnormal; Notable for the following components:      Result Value   Color, Urine STRAW (*)    Specific Gravity, Urine 1.004 (*)    All other components within normal limits  LIPASE, BLOOD  COMPREHENSIVE METABOLIC PANEL  CBC   I-STAT BETA HCG BLOOD, ED ([facility], [facility], [facility] ONLY)   ____________________________________________  RADIOLOGY  Ct Abdomen Pelvis W Contrast  Result Date: 01/11/2018 CLINICAL DATA:  Right upper quadrant pain for the past 4 days. EXAM: CT ABDOMEN AND PELVIS WITH CONTRAST TECHNIQUE: Multidetector CT imaging of the abdomen and pelvis was performed using the standard protocol following bolus administration of intravenous contrast. CONTRAST:  116mL OMNIPAQUE IOHEXOL 300 MG/ML  SOLN COMPARISON:  Right upper quadrant ultrasound from same day. FINDINGS: Lower chest: No acute abnormality. Hepatobiliary: No focal liver abnormality is seen. No gallstones, gallbladder wall thickening, or biliary dilatation. Pancreas: Unremarkable. No pancreatic ductal dilatation or surrounding inflammatory changes. Spleen: Normal in size without focal abnormality. Adrenals/Urinary Tract: The adrenal glands are unremarkable. 1.4 cm simple cyst in the left kidney. Tiny subcentimeter low-density lesion in the right kidney is too small to characterize. Punctate left renal calculus. No hydronephrosis. The bladder is unremarkable for the degree of distention. Stomach/Bowel: Stomach is within normal limits. Appendix appears normal. No evidence of bowel wall thickening, distention, or inflammatory changes. Vascular/Lymphatic: No significant vascular findings are present. No enlarged abdominal or pelvic lymph nodes. Reproductive: Uterus and bilateral adnexa are unremarkable. Other: Tiny fat containing umbilical hernia. No free fluid or pneumoperitoneum. Musculoskeletal: No acute or significant osseous findings. IMPRESSION: 1.  No acute intra-abdominal process. 2. Punctate nonobstructive left nephrolithiasis. Electronically Signed   By: Titus Dubin M.D.   On: 01/11/2018 17:54   US Abdomen Limited Ruq  Result Date: 01/11/2018 CLINICAL DATA:  Right upper quadrant abdominal pain with nausea and vomiting for 4 days. EXAM: ULTRASOUND ABDOMEN LIMITED  RIGHT UPPER QUADRANT COMPARISON:  None. FINDINGS: Gallbladder: No gallstones or wall thickening visualized. No sonographic Murphy sign noted by sonographer. Common bile duct: Diameter: 3 mm Liver: No focal lesion identified. Within normal limits in parenchymal echogenicity. Mild intrahepatic biliary dilatation was suggested although a tubular structure indicated on a color Doppler image without internal flow could also represent a vessel oriented perpendicularly to the ultrasound beam rather than a dilated bowel duct. Portal vein is patent on color Doppler imaging with normal direction of blood flow towards the liver. IMPRESSION: 1. Normal appearance of the gallbladder. 2. Possible mild intrahepatic biliary dilatation without focal liver abnormality identified. No extrahepatic biliary dilatation. Electronically Signed   By: Logan Bores M.D.   On: 01/11/2018 15:03    ____________________________________________   PROCEDURES  Procedure(s) performed:   Procedures  None ____________________________________________   INITIAL IMPRESSION / ASSESSMENT AND PLAN / ED COURSE  Pertinent labs & imaging results that were available during my care of the patient were reviewed by me and considered in my medical decision making (see chart for details).  Patient presents to the emergency department with right upper quadrant abdominal pain.  She has focal tenderness.  Labs from triage reviewed with no elevated bilirubin or leukocytosis.  Patient's symptoms do not appear to be Cardiovascular  in etiology.   [place] negative for acute process. Given focal tenderness, will follow CT imaging.   CT imaging negative for acute process. Plan for symptom mgmt at PCP follow up.  ____________________________________________  FINAL CLINICAL IMPRESSION(S) / ED DIAGNOSES  Final diagnoses:  RUQ abdominal pain     MEDICATIONS GIVEN DURING THIS VISIT:  Medications  iohexol (OMNIPAQUE) 300 MG/ML solution 100 mL (100 mLs  Intravenous Contrast Given 01/11/18 1730)     NEW OUTPATIENT MEDICATIONS STARTED DURING THIS VISIT:  Discharge Medication List as of 01/11/2018  6:10 PM    START taking these medications   Details  dicyclomine (BENTYL) 20 MG tablet Take 1 tablet (20 mg total) by mouth 3 (three) times daily as needed for spasms (abdominal cramping)., Starting Tue 01/11/2018, Print    pantoprazole (PROTONIX) 40 MG tablet Take 1 tablet (40 mg total) by mouth daily., Starting Tue 01/11/2018, Until Thu 02/10/2018, Print    sucralfate (CARAFATE) 1 g tablet Take 1 tablet (1 g total) by mouth 4 (four) times daily -  with meals and at bedtime for 7 days., Starting Tue 01/11/2018, Until Tue 01/18/2018, Print        Note:  This document was prepared using Dragon voice recognition software and may include unintentional dictation errors.  Nanda Quinton, MD Emergency Medicine    Fairley Copher, [facility], MD 01/11/18 2028

## 2018-01-11 NOTE — Discharge Instructions (Signed)

## 2018-01-11 NOTE — ED Notes (Signed)
Called [place] will be sending for pt in 15 min

## 2018-01-11 NOTE — ED Triage Notes (Signed)
Pt reports RUQ pain for the past 4 days was sent here by UC to rule out gallbladder. Pt reports diarrhea that began today 4 episodes.

## 2018-06-08 ENCOUNTER — Telehealth: Payer: Self-pay | Admitting: Family Medicine

## 2018-06-08 ENCOUNTER — Telehealth: Payer: Self-pay | Admitting: *Deleted

## 2018-06-08 NOTE — Telephone Encounter (Signed)
Spoke with pt. She states she was diagnosed by urgent care. Advised pt she has not seen PCP since 2017 and she will need OV before referral could be made. Transferred pt to front office to schedule appt.  Copied from [place] [id number]. Topic: General - Inquiry >> Jun 08, 2018  2:07 PM Mathis Bud wrote: Reason for CRM: patient was diagnosed with baker cyst  behind the knee, and would like to know if PCP would do steroids injections. Can PCP or nurse give a call back.  Call back # [phone]

## 2018-06-08 NOTE — Telephone Encounter (Signed)
Please advise if you are still willing to see pt and assist in her needs

## 2018-06-09 DIAGNOSIS — M25562 Pain in left knee: Secondary | ICD-10-CM | POA: Insufficient documentation

## 2018-06-09 NOTE — Telephone Encounter (Signed)
I do not do those but we can get her into ortho for this  Find out which knee and place referral --- if pain is severe please tell gwen to do asap please

## 2018-06-10 NOTE — Telephone Encounter (Signed)
Patient neighbor is an orthopedic and got her in and she had her knee drained and she feels better.

## 2018-09-06 HISTORY — PX: OTHER SURGICAL HISTORY: SHX169

## 2018-09-28 ENCOUNTER — Encounter: Payer: Self-pay | Admitting: Gynecology

## 2018-12-17 ENCOUNTER — Encounter: Payer: Self-pay | Admitting: Women's Health

## 2019-03-07 DIAGNOSIS — M65312 Trigger thumb, left thumb: Secondary | ICD-10-CM | POA: Insufficient documentation

## 2019-03-24 ENCOUNTER — Other Ambulatory Visit: Payer: Self-pay

## 2019-03-27 ENCOUNTER — Ambulatory Visit (INDEPENDENT_AMBULATORY_CARE_PROVIDER_SITE_OTHER): Payer: 59 | Admitting: Family Medicine

## 2019-03-27 ENCOUNTER — Encounter: Payer: Self-pay | Admitting: Family Medicine

## 2019-03-27 ENCOUNTER — Other Ambulatory Visit: Payer: Self-pay

## 2019-03-27 VITALS — BP 104/57 | HR 66 | Temp 96.4°F | Resp 17 | Ht 61.0 in | Wt 134.4 lb

## 2019-03-27 DIAGNOSIS — Z0000 Encounter for general adult medical examination without abnormal findings: Secondary | ICD-10-CM

## 2019-03-27 DIAGNOSIS — Z833 Family history of diabetes mellitus: Secondary | ICD-10-CM

## 2019-03-27 LAB — COMPREHENSIVE METABOLIC PANEL
ALT: 25 U/L (ref 0–35)
AST: 30 U/L (ref 0–37)
Albumin: 4.2 g/dL (ref 3.5–5.2)
Alkaline Phosphatase: 79 U/L (ref 39–117)
BUN: 10 mg/dL (ref 6–23)
CO2: 31 mEq/L (ref 19–32)
Calcium: 9.3 mg/dL (ref 8.4–10.5)
Chloride: 102 mEq/L (ref 96–112)
Creatinine, Ser: 0.63 mg/dL (ref 0.40–1.20)
GFR: 97.67 mL/min (ref >60.00)
Glucose, Bld: 83 mg/dL (ref 70–99)
Potassium: 4 mEq/L (ref 3.5–5.1)
Sodium: 140 mEq/L (ref 135–145)
Total Bilirubin: 0.6 mg/dL (ref 0.2–1.2)
Total Protein: 6.9 g/dL (ref 6.0–8.3)

## 2019-03-27 LAB — CBC WITH DIFFERENTIAL/PLATELET
Basophils Absolute: 0 10*3/uL (ref 0.0–0.1)
Basophils Relative: 0.9 % (ref 0.0–3.0)
Eosinophils Absolute: 0.1 10*3/uL (ref 0.0–0.7)
Eosinophils Relative: 2.2 % (ref 0.0–5.0)
HCT: 37.6 % (ref 36.0–46.0)
Hemoglobin: 12.6 g/dL (ref 12.0–15.0)
Lymphocytes Relative: 33.6 % (ref 12.0–46.0)
Lymphs Abs: 1.5 10*3/uL (ref 0.7–4.0)
MCHC: 33.6 g/dL (ref 30.0–36.0)
MCV: 93.2 fl (ref 78.0–100.0)
Monocytes Absolute: 0.3 10*3/uL (ref 0.1–1.0)
Monocytes Relative: 8 % (ref 3.0–12.0)
Neutro Abs: 2.4 10*3/uL (ref 1.4–7.7)
Neutrophils Relative %: 55.3 % (ref 43.0–77.0)
Platelets: 329 10*3/uL (ref 150.0–400.0)
RBC: 4.03 Mil/uL (ref 3.87–5.11)
RDW: 13.3 % (ref 11.5–15.5)
WBC: 4.4 10*3/uL (ref 4.0–10.5)

## 2019-03-27 LAB — HEMOGLOBIN A1C: Hgb A1c MFr Bld: 5.3 % (ref 4.6–6.5)

## 2019-03-27 LAB — LIPID PANEL
Cholesterol: 177 mg/dL (ref 0–200)
HDL: 68.5 mg/dL (ref >39.00)
LDL Cholesterol: 99 mg/dL (ref 0–99)
NonHDL: 108.96
Total CHOL/HDL Ratio: 3
Triglycerides: 52 mg/dL (ref 0.0–149.0)
VLDL: 10.4 mg/dL (ref 0.0–40.0)

## 2019-03-27 LAB — TSH: TSH: 1.46 u[IU]/mL (ref 0.35–4.50)

## 2019-03-27 NOTE — Patient Instructions (Signed)

## 2019-03-27 NOTE — Progress Notes (Signed)
Subjective:     Patricia Welch is a 59 y.o. female and is here for a comprehensive physical exam. The patient reports no problems.  Social History   Socioeconomic History  . Marital status: Married    Spouse name: Not on file  . Number of children: Not on file  . Years of education: Not on file  . Highest education level: Not on file  Occupational History  . 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 Smoking status: Never Smoker  . Smokeless tobacco: Never Used  Substance and Sexual Activity  . Alcohol use: Yes    Comment: rare glass of wine  . Drug use: No  . Sexual activity: Yes    Partners: Male    Birth control/protection: None  Other Topics Concern  . Not on file  Social History Narrative   Exercise--  Recently started   Social Determinants of Health   Financial Resource Strain:   . Difficulty of Paying Living Expenses:   Food Insecurity:   . Worried About Charity fundraiser in the Last Year:   . Arboriculturist in the Last Year:   Transportation Needs:   . Film/video editor (Medical):   Marland Kitchen Lack of Transportation (Non-Medical):   Physical Activity:   . Days of Exercise per Week:   . Minutes of Exercise per Session:   Stress:   . Feeling of Stress :   Social Connections:   . Frequency of Communication with Friends and Family:   . Frequency of Social Gatherings with Friends and Family:   . Attends Religious Services:   . Active Member of Clubs or Organizations:   . Attends Archivist Meetings:   Marland Kitchen Marital Status:   Intimate Partner Violence:   . Fear of Current or Ex-Partner:   . Emotionally Abused:   Marland Kitchen Physically Abused:   . Sexually Abused:    Health Maintenance  Topic Date Due  . Hepatitis C Screening  Never done  . HIV Screening  Never done  . TETANUS/TDAP  07/12/2018  . INFLUENZA VACCINE  Never done  . MAMMOGRAM  12/17/2019  . PAP SMEAR-Modifier  11/09/2020  . COLONOSCOPY  09/26/2025    The following  portions of the patient's history were reviewed and updated as appropriate:  She  has a past medical history of Premature delivery. She does not have any pertinent problems on file. She  has a past surgical history that includes Cesarean section; Cervical biopsy w/ loop electrode excision (1996); and Eye surgery. Her family history includes Alcohol abuse in her father; Colon polyps in her mother; Diabetes in her mother; Heart disease (age of onset: 46) in her father; Hypertension in her mother. She  reports that she has never smoked. She has never used smokeless tobacco. She reports current alcohol use. She reports that she does not use drugs. She has a current medication list which includes the following prescription(s): dicyclomine, estradiol, pantoprazole, and sucralfate. Current Outpatient Medications on File Prior to Visit  Medication Sig Dispense Refill  . dicyclomine (BENTYL) 20 MG tablet Take 1 tablet (20 mg total) by mouth 3 (three) times daily as needed for spasms (abdominal cramping). (Patient not taking: Reported on 03/27/2019) 20 tablet 0  . estradiol (ESTRING) 2 MG vaginal ring Place 2 mg vaginally every 3 (three) months. follow package directions (Patient not taking: Reported on 03/27/2019) 1 each 4  . pantoprazole (PROTONIX) 40 MG tablet Take 1 tablet (  40 mg total) by mouth daily. 30 tablet 0  . sucralfate (CARAFATE) 1 g tablet Take 1 tablet (1 g total) by mouth 4 (four) times daily -  with meals and at bedtime for 7 days. 28 tablet 0   No current facility-administered medications on file prior to visit.   She is allergic to penicillins..  Review of Systems Review of Systems  Constitutional: Negative for activity change, appetite change and fatigue.  HENT: Negative for hearing loss, congestion, tinnitus and ear discharge.  dentist q40m Eyes: Negative for visual disturbance (see optho q1y -- vision corrected to 20/20 with glasses).  Respiratory: Negative for cough, chest tightness  and shortness of breath.   Cardiovascular: Negative for chest pain, palpitations and leg swelling.  Gastrointestinal: Negative for abdominal pain, diarrhea, constipation and abdominal distention.  Genitourinary: Negative for urgency, frequency, decreased urine volume and difficulty urinating.  Musculoskeletal: Negative for back pain, arthralgias and gait problem.  Skin: Negative for color change, pallor and rash.  Neurological: Negative for dizziness, light-headedness, numbness and headaches.  Hematological: Negative for adenopathy. Does not bruise/bleed easily.  Psychiatric/Behavioral: Negative for suicidal ideas, confusion, sleep disturbance, self-injury, dysphoric mood, decreased concentration and agitation.       Objective:    BP (!) 104/57 (BP Location: Left Arm, Patient Position: Sitting, Cuff Size: Normal)   Pulse 66   Temp (!) 96.4 F (35.8 C) (Temporal)   Resp 17   Ht 5\' 1"  (1.549 m)   Wt 134 lb 6 oz (61 kg)   LMP 04/10/2012   SpO2 99%   BMI 25.39 kg/m  General appearance: alert, cooperative, appears stated age and no distress Head: Normocephalic, without obvious abnormality, atraumatic Eyes: negative findings: lids and lashes normal, conjunctivae and sclerae normal and pupils equal, round, reactive to light and accomodation Ears: normal TM's and external ear canals both ears Nose: Nares normal. Septum midline. Mucosa normal. No drainage or sinus tenderness. Throat: lips, mucosa, and tongue normal; teeth and gums normal Neck: no adenopathy, no carotid bruit, no JVD, supple, symmetrical, trachea midline and thyroid not enlarged, symmetric, no tenderness/mass/nodules Back: symmetric, no curvature. ROM normal. No CVA tenderness. Lungs: clear to auscultation bilaterally Breasts: gyn Heart: regular rate and rhythm, S1, S2 normal, no murmur, click, rub or gallop Abdomen: soft, non-tender; bowel sounds normal; no masses,  no organomegaly Pelvic: deferred--gyn Extremities:  extremities normal, atraumatic, no cyanosis or edema Pulses: 2+ and symmetric Skin: Skin color, texture, turgor normal. No rashes or lesions Lymph nodes: Cervical, supraclavicular, and axillary nodes normal. Neurologic: Alert and oriented X 3, normal strength and tone. Normal symmetric reflexes. Normal coordination and gait    Assessment:    Healthy female exam.      Plan:    ghm utd Check labs  See After Visit Summary for Counseling Recommendations

## 2019-04-03 ENCOUNTER — Encounter: Payer: 59 | Admitting: Women's Health

## 2019-04-24 ENCOUNTER — Encounter: Payer: 59 | Admitting: Women's Health

## 2019-05-26 ENCOUNTER — Ambulatory Visit: Payer: 59 | Admitting: Family Medicine

## 2019-06-09 ENCOUNTER — Encounter: Payer: Self-pay | Admitting: Family Medicine

## 2019-06-09 ENCOUNTER — Ambulatory Visit: Payer: 59 | Admitting: Family Medicine

## 2019-06-09 ENCOUNTER — Other Ambulatory Visit: Payer: Self-pay

## 2019-06-09 VITALS — BP 100/60 | HR 70 | Temp 97.6°F | Resp 18 | Ht 61.0 in | Wt 128.2 lb

## 2019-06-09 DIAGNOSIS — B001 Herpesviral vesicular dermatitis: Secondary | ICD-10-CM

## 2019-06-09 DIAGNOSIS — Z23 Encounter for immunization: Secondary | ICD-10-CM

## 2019-06-09 DIAGNOSIS — L918 Other hypertrophic disorders of the skin: Secondary | ICD-10-CM

## 2019-06-09 MED ORDER — VALACYCLOVIR HCL 1 G PO TABS: ORAL_TABLET | ORAL | 3 refills | Status: DC

## 2019-06-09 NOTE — Patient Instructions (Signed)
Skin Tag, Adult  A skin tag (acrochordon) is a soft, extra growth of skin. Most skin tags are flesh-colored and rarely bigger than a pencil eraser. They commonly form near areas where there are folds in the skin, such as the armpit or groin. Skin tags are not dangerous, and they do not spread from person to person (are not contagious). You may have one skin tag or several. Skin tags do not require treatment. However, your health care provider may recommend removal of a skin tag if it:  Gets irritated from clothing.  Bleeds.  Is visible and unsightly. Your health care provider can remove skin tags with a simple surgical procedure or a procedure that involves freezing the skin tag. Follow these instructions at home:  Watch for any changes in your skin tag. A normal skin tag does not require any other special care at home.  Take over-the-counter and prescription medicines only as told by your health care provider.  Keep all follow-up visits as told by your health care provider. This is important. Contact a health care provider if:  You have a skin tag that: ? Becomes painful. ? Changes color. ? Bleeds. ? Swells.  You develop more skin tags. This information is not intended to replace advice given to you by your health care provider. Make sure you discuss any questions you have with your health care provider. Document Revised: 12/04/2016 Document Reviewed: 01/06/2015 Elsevier Patient Education  2020 Elsevier Inc.  

## 2019-06-09 NOTE — Progress Notes (Signed)
Skin Tag Removal Procedure Note  Pre-operative Diagnosis: Classic skin tags (acrochordon)  Post-operative Diagnosis: Classic skin tags (acrochordon)  Locations:anterior trunk  Indications: irritated   Anesthesia: xylocaine 1cc  Procedure Details The risks (including bleeding and infection) and benefits of the procedure and Written informed consent obtained. Using sterile iris scissors, multiple skin tags were snipped off at their bases after cleansing with Betadine.  Bleeding was controlled by pressure.-- silver nitrate  Findings: Pathognomonic benign lesions  not sent for pathological exam.  Condition: Stable  Complications: none.  Plan: 1. Instructed to keep the wounds dry and covered for 24-48h and clean thereafter. 2. Warning signs of infection were reviewed.   3. Recommended that the patient use OTC acetaminophen as needed for pain.  4. Return as needed.

## 2019-08-09 ENCOUNTER — Ambulatory Visit: Payer: 59

## 2019-08-16 ENCOUNTER — Ambulatory Visit (INDEPENDENT_AMBULATORY_CARE_PROVIDER_SITE_OTHER): Payer: 59

## 2019-08-16 ENCOUNTER — Other Ambulatory Visit: Payer: Self-pay

## 2019-08-16 DIAGNOSIS — Z23 Encounter for immunization: Secondary | ICD-10-CM

## 2019-08-16 NOTE — Progress Notes (Signed)
Patient due for Shingles Zoster injection per Dr.Lowne.  Injection give IM , Left deltoid, Patient tolerated injection well.

## 2019-09-04 ENCOUNTER — Encounter: Payer: Self-pay | Admitting: Podiatry

## 2019-09-04 ENCOUNTER — Ambulatory Visit: Payer: 59 | Admitting: Podiatry

## 2019-09-04 ENCOUNTER — Other Ambulatory Visit: Payer: Self-pay

## 2019-09-04 DIAGNOSIS — B351 Tinea unguium: Secondary | ICD-10-CM

## 2019-09-04 MED ORDER — TERBINAFINE HCL 250 MG PO TABS: 250.0000 mg | ORAL_TABLET | Freq: Every day | ORAL | 0 refills | Status: DC

## 2019-09-04 NOTE — Progress Notes (Signed)
  Subjective:  Patient ID: Patricia Welch, female    DOB: Apr 15, 1960,  MRN: [id number]  Chief Complaint  Patient presents with  . Nail Problem    thick painful toenails    59 y.o. female presents with the above complaint. History confirmed with patient.  Toenails have white discoloration and this has been worsening.  Objective:  Physical Exam: warm, good capillary refill, no trophic changes or ulcerative lesions, normal DP and PT pulses and normal sensory exam. Left Foot: Onychomycosis of first, second, third toenails Right Foot: Onychomycosis of first, second, third toenails      Assessment:   1. Onychomycosis      Plan:  Patient was evaluated and treated and all questions answered.   Onychomycosis -Educated on etiology of nail fungus. -She has no history of liver disease, only drinks socially, has no interacting medications -eRx for oral terbinafine #90. Educated on risks and benefits of the medication.     Return in about 3 months (around 12/05/2019) for nail re-check.

## 2019-09-12 ENCOUNTER — Telehealth: Payer: Self-pay

## 2019-09-12 MED ORDER — CICLOPIROX 8 % EX SOLN: Freq: Every day | CUTANEOUS | 2 refills | Status: DC

## 2019-09-12 NOTE — Telephone Encounter (Signed)
Pt came in for a toenail fungus was prescribed Lamisil. Pt would like to change the medication because she has lost her sense of taste and smell and looked up the medication which is a possible side effect. Please advise.

## 2019-09-12 NOTE — Telephone Encounter (Signed)
Spoke with her and sent Rx for Ciclopirox, d/c terbinafine

## 2019-12-06 ENCOUNTER — Ambulatory Visit: Payer: 59 | Admitting: Nurse Practitioner

## 2019-12-06 ENCOUNTER — Encounter: Payer: Self-pay | Admitting: Nurse Practitioner

## 2019-12-06 ENCOUNTER — Other Ambulatory Visit: Payer: Self-pay

## 2019-12-06 VITALS — BP 114/70 | Ht 62.5 in | Wt 124.0 lb

## 2019-12-06 DIAGNOSIS — Z01419 Encounter for gynecological examination (general) (routine) without abnormal findings: Secondary | ICD-10-CM

## 2019-12-06 NOTE — Patient Instructions (Signed)

## 2019-12-06 NOTE — Progress Notes (Signed)
   Quaniyah Bugh [birth year] [id number]   History:  59 y.o. G1P1L0 presents for annual exam without GYN complaints. Postmenopausal - no HRT, no bleeding. 1996 LEEP, subsequent paps normal. Normal mammogram history.   Gynecologic History Patient's last menstrual period was 04/10/2012.   Contraception: post menopausal status Last Pap: 11/09/2017. Results were: normal Last mammogram: 2020. Results were: normal Last colonoscopy: 2017. Results were: polyps Last Dexa: 11/2017. Results were: normal  Past medical history, past surgical history, family history and social history were all reviewed and documented in the EPIC chart.  ROS:  A ROS was performed and pertinent positives and negatives are included.  Exam:  Vitals:   12/06/19 0941  BP: 114/70  Weight: 124 lb (56.2 kg)  Height: 5' 2.5" (1.588 m)   Body mass index is 22.32 kg/m.  General appearance:  Normal Thyroid:  Symmetrical, normal in size, without palpable masses or nodularity. Respiratory  Auscultation:  Clear without wheezing or rhonchi Cardiovascular  Auscultation:  Regular rate, without rubs, murmurs or gallops  Edema/varicosities:  Not grossly evident Abdominal  Soft,nontender, without masses, guarding or rebound.  Liver/spleen:  No organomegaly noted  Hernia:  None appreciated  Skin  Inspection:  Grossly normal   Breasts: Examined lying and sitting.   Right: Without masses, retractions, discharge or axillary adenopathy.   Left: Without masses, retractions, discharge or axillary adenopathy. Gentitourinary   Inguinal/mons:  Normal without inguinal adenopathy  External genitalia:  Normal  BUS/Urethra/Skene's glands:  Normal  Vagina:  Atrophic changes  Cervix:  Normal  Uterus:  Normal in size, shape and contour.  Midline and mobile  Adnexa/parametria:     Rt: Without masses or tenderness.   Lt: Without masses or tenderness.  Anus and perineum: Normal  Digital rectal exam: Normal sphincter tone without  palpated masses or tenderness  Assessment/Plan:  59 y.o. G1P1 for annual exam.   Well female exam with routine gynecological exam - Education provided on SBEs, importance of preventative screenings, current guidelines, high calcium diet, regular exercise, and multivitamin daily. Labs with PCP.   Screening for cervical cancer -1996 LEEP, subsequent Paps normal.  Will repeat Pap next year at 3-year interval.  Screening for breast cancer -normal mammogram history.  Continue annual screenings.  Normal breast exam today.  Screening for colon cancer - Will repeat screening colonoscopy at GI's recommended interval.  Follow-up in 1 year for annual.        Tamela Gammon [facility], 9:49 AM 12/06/2019

## 2020-10-18 IMAGING — CT CT ABD-PELV W/ CM
2 of 5 series · 16 of 46 positions shown, 18 images · IV contrast (Omni 300)
Comparison: Right upper quadrant ultrasound from same day.

CLINICAL DATA: Right upper quadrant pain for the past 4 days.

EXAM:
CT ABDOMEN AND PELVIS WITH CONTRAST
TECHNIQUE: Multidetector CT imaging of the abdomen and pelvis was performed
using the standard protocol following bolus administration of
intravenous contrast.
CONTRAST:  100mL OMNIPAQUE IOHEXOL 300 MG/ML  SOLN

[Series 3: a/p w/ 5mm · axial · 0.79mm/px · z∈[+884,+1239]mm · 13 of 81 slices shown, 15 images]
[im 5/81  soft-tissue]
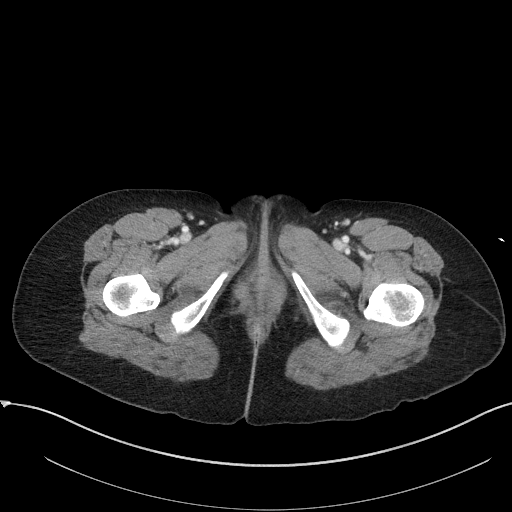
[im 5/81  bone]
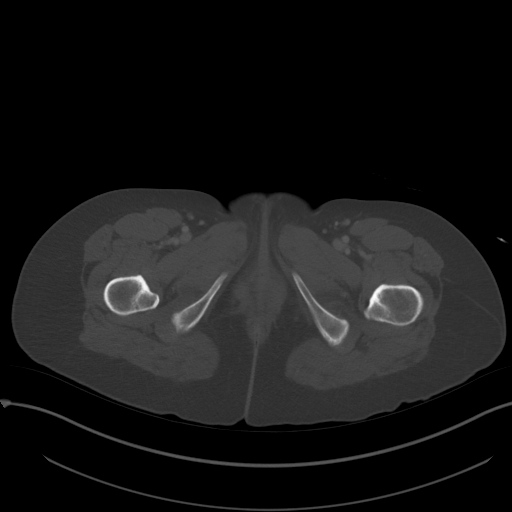
[im 13/81  soft-tissue]
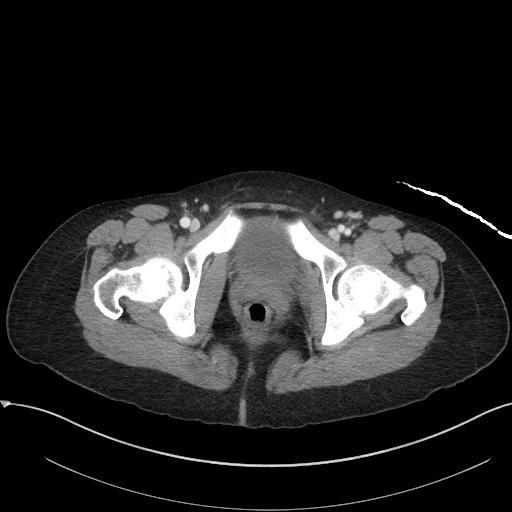
[im 17/81  soft-tissue]
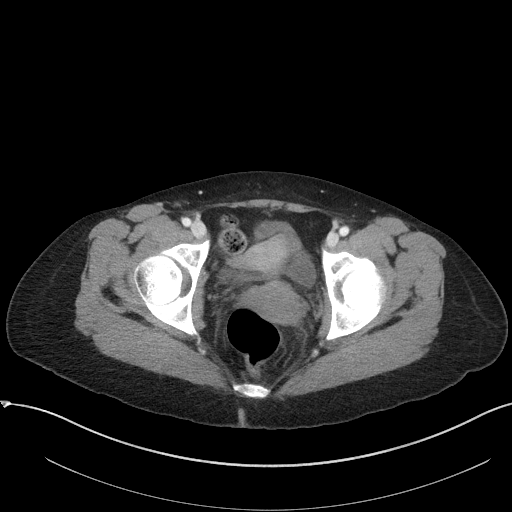
[im 22/81  soft-tissue]
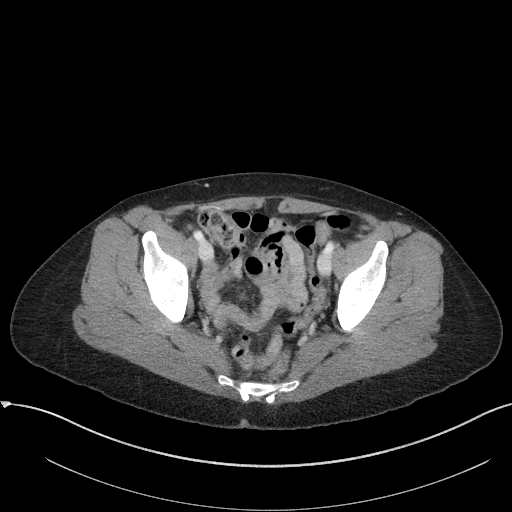
[im 30/81  soft-tissue]
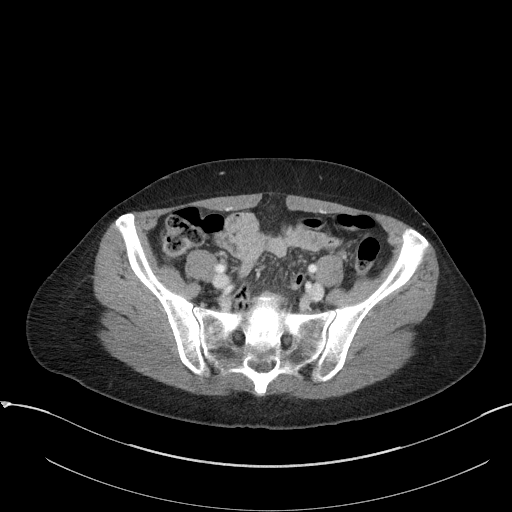
[im 34/81  soft-tissue]
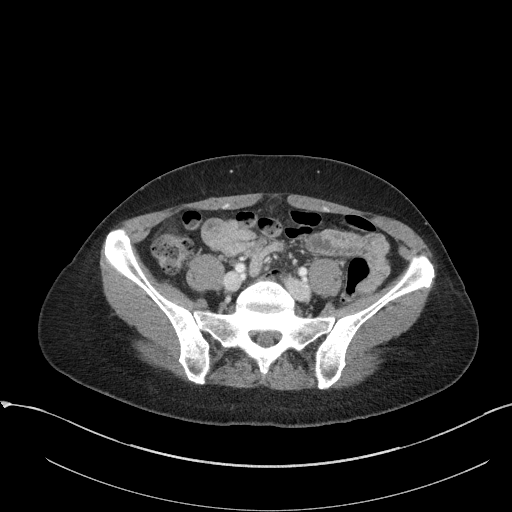
[im 43/81  soft-tissue]
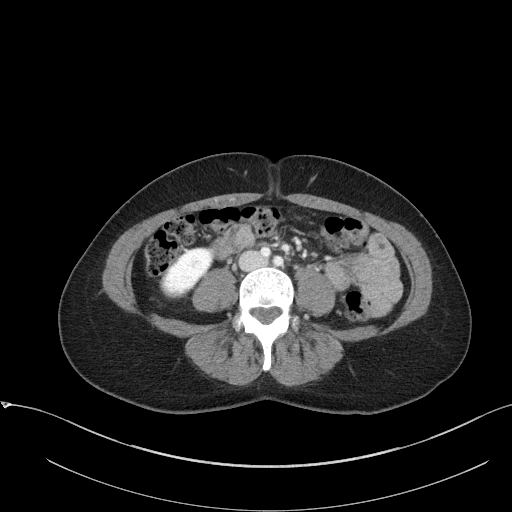
[im 47/81  soft-tissue]
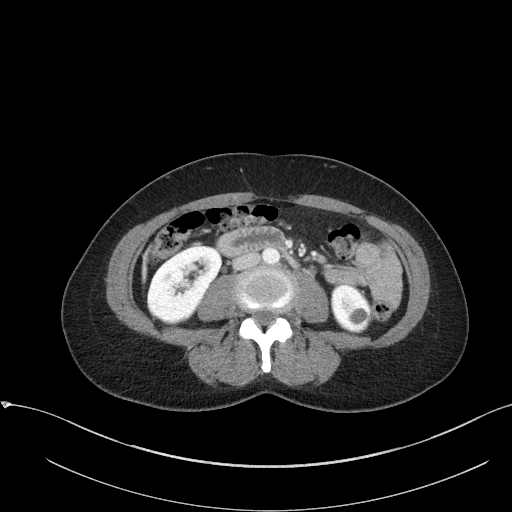
[im 51/81  soft-tissue]
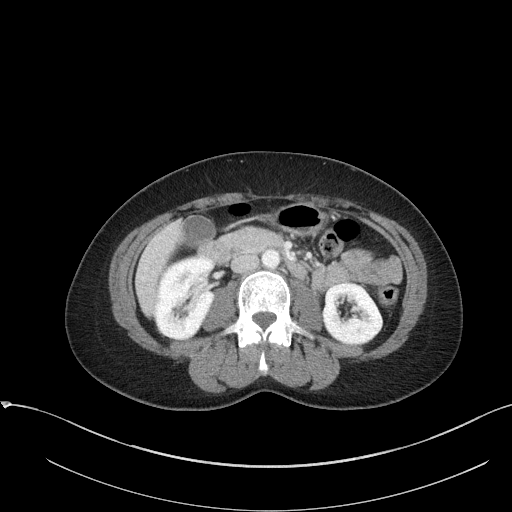
[im 51/81  bone]
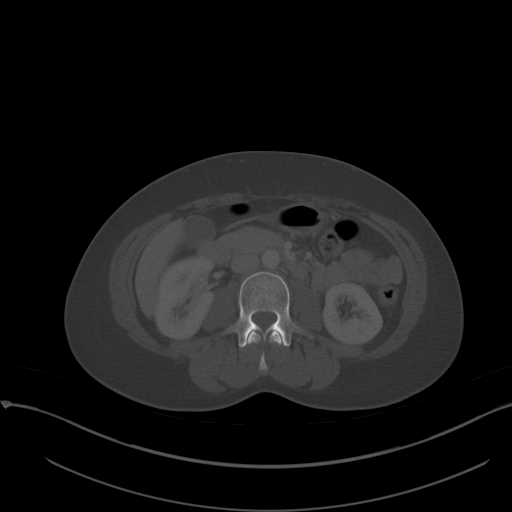
[im 59/81  soft-tissue]
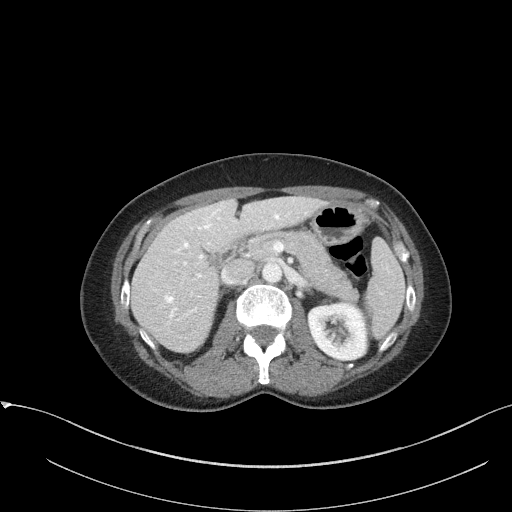
[im 64/81  soft-tissue]
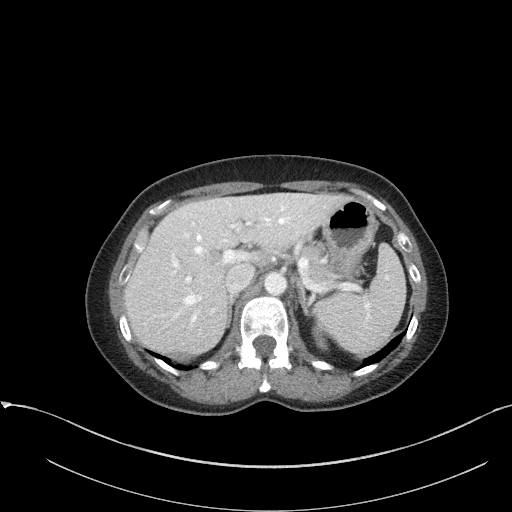
[im 68/81  soft-tissue]
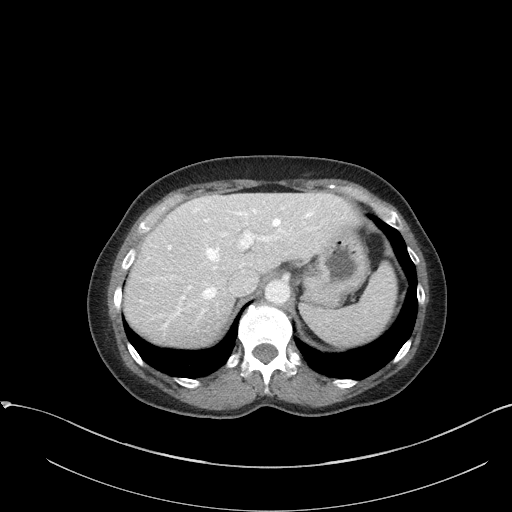
[im 76/81  soft-tissue]
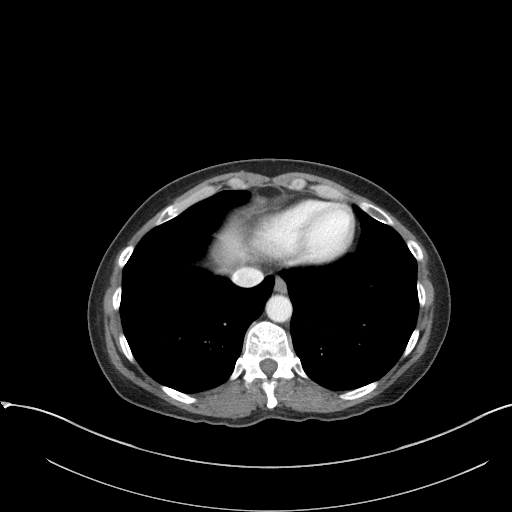

[Series 6: a/p w/ cor · coronal · 0.57mm/px · 3 of 110 slices shown]
[im 37/110  soft-tissue]
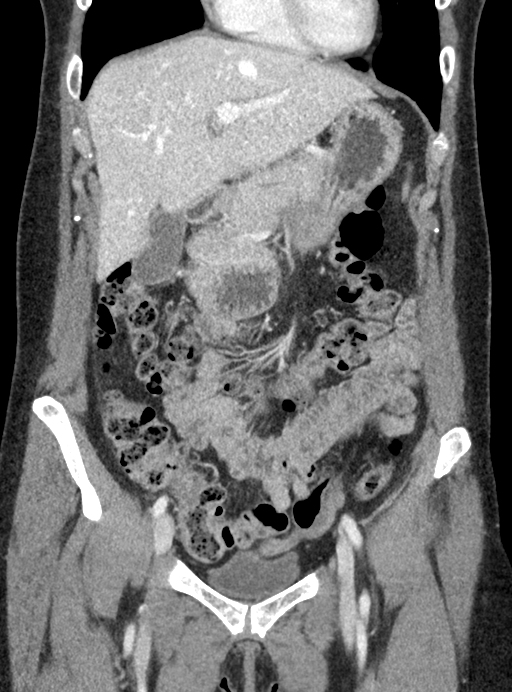
[im 49/110  soft-tissue]
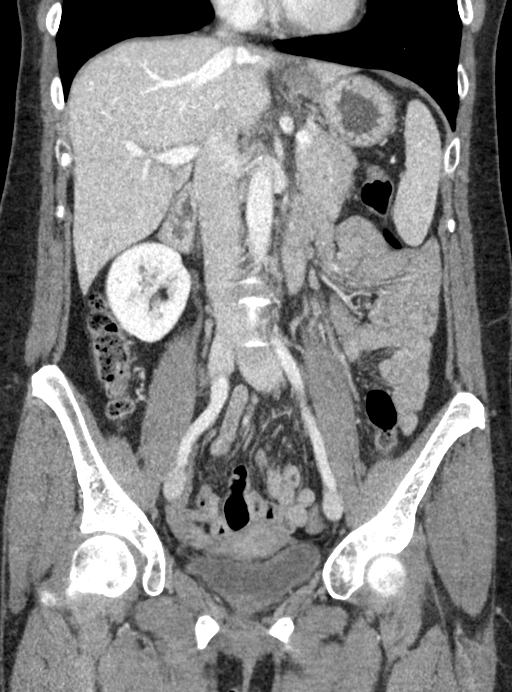
[im 61/110  soft-tissue]
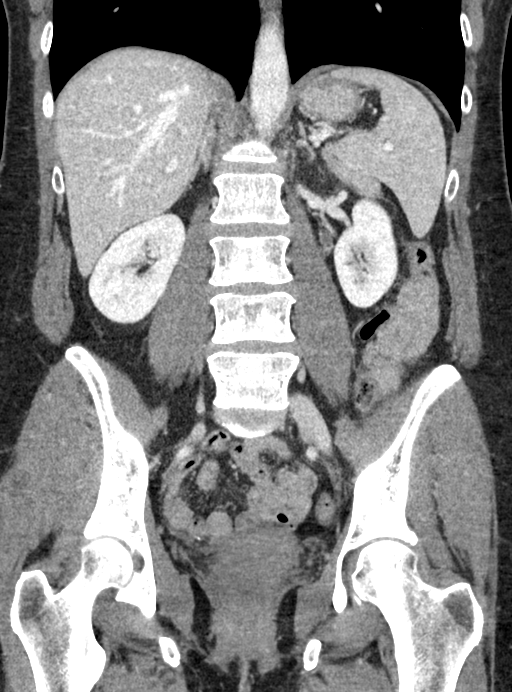

[16 of 46 positions shown; findings below may reference images not displayed]

FINDINGS: Lower chest: No acute abnormality.

Hepatobiliary: No focal liver abnormality is seen. No gallstones,
gallbladder wall thickening, or biliary dilatation.

Pancreas: Unremarkable. No pancreatic ductal dilatation or
surrounding inflammatory changes.

Spleen: Normal in size without focal abnormality.

Adrenals/Urinary Tract: The adrenal glands are unremarkable. 1.4 cm
simple cyst in the left kidney. Tiny subcentimeter low-density
lesion in the right kidney is too small to characterize. Punctate
left renal calculus. No hydronephrosis. The bladder is unremarkable
for the degree of distention.

Stomach/Bowel: Stomach is within normal limits. Appendix appears
normal. No evidence of bowel wall thickening, distention, or
inflammatory changes.

Vascular/Lymphatic: No significant vascular findings are present. No
enlarged abdominal or pelvic lymph nodes.

Reproductive: Uterus and bilateral adnexa are unremarkable.

Other: Tiny fat containing umbilical hernia. No free fluid or
pneumoperitoneum.

Musculoskeletal: No acute or significant osseous findings.
IMPRESSION: 1.  No acute intra-abdominal process.
2. Punctate nonobstructive left nephrolithiasis.

## 2020-12-09 ENCOUNTER — Ambulatory Visit: Payer: 59 | Admitting: Nurse Practitioner

## 2021-01-22 ENCOUNTER — Encounter: Payer: Self-pay | Admitting: Nurse Practitioner

## 2021-01-28 ENCOUNTER — Ambulatory Visit (INDEPENDENT_AMBULATORY_CARE_PROVIDER_SITE_OTHER): Payer: 59 | Admitting: Nurse Practitioner

## 2021-01-28 ENCOUNTER — Other Ambulatory Visit: Payer: Self-pay

## 2021-01-28 ENCOUNTER — Encounter: Payer: Self-pay | Admitting: Nurse Practitioner

## 2021-01-28 VITALS — BP 116/70 | Ht 62.0 in | Wt 129.0 lb

## 2021-01-28 DIAGNOSIS — Z780 Asymptomatic menopausal state: Secondary | ICD-10-CM | POA: Diagnosis not present

## 2021-01-28 DIAGNOSIS — Z01419 Encounter for gynecological examination (general) (routine) without abnormal findings: Secondary | ICD-10-CM

## 2021-01-28 NOTE — Progress Notes (Signed)
° °  Patricia Welch [birth year] [id number]   History:  61 y.o. G1P1L0 presents for annual exam without GYN complaints. Postmenopausal - no HRT, no bleeding. 1996 LEEP, subsequent paps normal. Normal mammogram history.   Gynecologic History Patient's last menstrual period was 04/10/2012.   Contraception/Family planning: post menopausal status Sexually active: Yes  Health Maintenance Last Pap: 11/09/2017. Results were: Normal, 5-year repeat Last mammogram: 01/21/2021. Results were: Normal Last colonoscopy: 2017. Results were: Benign polyps, 10-year recall Last Dexa: 11/2017. Results were: Normal  Past medical history, past surgical history, family history and social history were all reviewed and documented in the EPIC chart. Married. Works for [employer].   ROS:  A ROS was performed and pertinent positives and negatives are included.  Exam:  Vitals:   01/28/21 1327  BP: 116/70  Weight: 129 lb (58.5 kg)  Height: 5\' 2"  (1.575 m)    Body mass index is 23.59 kg/m.  General appearance:  Normal Thyroid:  Symmetrical, normal in size, without palpable masses or nodularity. Respiratory  Auscultation:  Clear without wheezing or rhonchi Cardiovascular  Auscultation:  Regular rate, without rubs, murmurs or gallops  Edema/varicosities:  Not grossly evident Abdominal  Soft,nontender, without masses, guarding or rebound.  Liver/spleen:  No organomegaly noted  Hernia:  None appreciated  Skin  Inspection:  Grossly normal   Breasts: Examined lying and sitting.   Right: Without masses, retractions, discharge or axillary adenopathy.   Left: Without masses, retractions, discharge or axillary adenopathy. Genitourinary   Inguinal/mons:  Normal without inguinal adenopathy  External genitalia:  Normal appearing vulva with no masses, tenderness, or lesions  BUS/Urethra/Skene's glands:  Normal  Vagina:  Normal appearing with normal color and discharge, no lesions. Atrophic changes  Cervix:   Normal appearing without discharge or lesions  Uterus:  Normal in size, shape and contour.  Midline and mobile, nontender  Adnexa/parametria:     Rt: Normal in size, without masses or tenderness.   Lt: Normal in size, without masses or tenderness.  Anus and perineum: Normal  Digital rectal exam: Normal sphincter tone without palpated masses or tenderness  Patient informed chaperone available to be present for breast and pelvic exam. Patient has requested no chaperone to be present. Patient has been advised what will be completed during breast and pelvic exam.   Assessment/Plan:  61 y.o. G1P1L0 for annual exam.   Well female exam with routine gynecological exam - Education provided on SBEs, importance of preventative screenings, current guidelines, high calcium diet, regular exercise, and multivitamin daily. Labs with PCP.   Screening for cervical cancer -1996 LEEP, subsequent Paps normal.  Will repeat Pap next year at 5-year interval.  Screening for breast cancer - Normal mammogram history.  Continue annual screenings.  Normal breast exam today.  Screening for colon cancer - 2017 colonoscopy. Will repeat screening colonoscopy at GI's recommended interval.  Screening for osteoporosis - Normal bone density 2019. Will repeat at 5-year interval per recommendation.   Follow-up in 1 year for annual.        Tamela Gammon [facility], 1:49 PM 01/28/2021

## 2022-03-16 ENCOUNTER — Encounter: Payer: Self-pay | Admitting: Nurse Practitioner

## 2022-03-19 ENCOUNTER — Encounter: Payer: Self-pay | Admitting: Nurse Practitioner

## 2023-01-14 ENCOUNTER — Ambulatory Visit: Payer: 59 | Admitting: Family Medicine

## 2023-01-27 ENCOUNTER — Ambulatory Visit: Payer: 59 | Admitting: Family Medicine

## 2023-02-09 ENCOUNTER — Ambulatory Visit: Payer: 59 | Admitting: Family Medicine

## 2023-02-09 VITALS — BP 109/45 | HR 62 | Ht 61.0 in | Wt 116.0 lb

## 2023-02-09 DIAGNOSIS — Z0000 Encounter for general adult medical examination without abnormal findings: Secondary | ICD-10-CM

## 2023-02-09 DIAGNOSIS — Z114 Encounter for screening for human immunodeficiency virus [HIV]: Secondary | ICD-10-CM

## 2023-02-09 DIAGNOSIS — Z1159 Encounter for screening for other viral diseases: Secondary | ICD-10-CM

## 2023-02-09 DIAGNOSIS — B001 Herpesviral vesicular dermatitis: Secondary | ICD-10-CM | POA: Diagnosis not present

## 2023-02-09 LAB — LIPID PANEL
Cholesterol: 199 mg/dL (ref 0–200)
HDL: 67.6 mg/dL (ref >39.00)
LDL Cholesterol: 121 mg/dL — ABNORMAL HIGH (ref 0–99)
NonHDL: 131.37
Total CHOL/HDL Ratio: 3
Triglycerides: 54 mg/dL (ref 0.0–149.0)
VLDL: 10.8 mg/dL (ref 0.0–40.0)

## 2023-02-09 LAB — CBC WITH DIFFERENTIAL/PLATELET
Basophils Absolute: 0.1 10*3/uL (ref 0.0–0.1)
Basophils Relative: 1.1 % (ref 0.0–3.0)
Eosinophils Absolute: 0.1 10*3/uL (ref 0.0–0.7)
Eosinophils Relative: 2 % (ref 0.0–5.0)
HCT: 40.5 % (ref 36.0–46.0)
Hemoglobin: 13.4 g/dL (ref 12.0–15.0)
Lymphocytes Relative: 43.6 % (ref 12.0–46.0)
Lymphs Abs: 2.2 10*3/uL (ref 0.7–4.0)
MCHC: 33 g/dL (ref 30.0–36.0)
MCV: 92.9 fL (ref 78.0–100.0)
Monocytes Absolute: 0.3 10*3/uL (ref 0.1–1.0)
Monocytes Relative: 5.2 % (ref 3.0–12.0)
Neutro Abs: 2.4 10*3/uL (ref 1.4–7.7)
Neutrophils Relative %: 48.1 % (ref 43.0–77.0)
Platelets: 315 10*3/uL (ref 150.0–400.0)
RBC: 4.36 Mil/uL (ref 3.87–5.11)
RDW: 12.7 % (ref 11.5–15.5)
WBC: 5 10*3/uL (ref 4.0–10.5)

## 2023-02-09 LAB — COMPREHENSIVE METABOLIC PANEL
ALT: 12 U/L (ref 0–35)
AST: 21 U/L (ref 0–37)
Albumin: 4.4 g/dL (ref 3.5–5.2)
Alkaline Phosphatase: 68 U/L (ref 39–117)
BUN: 11 mg/dL (ref 6–23)
CO2: 32 meq/L (ref 19–32)
Calcium: 9.2 mg/dL (ref 8.4–10.5)
Chloride: 101 meq/L (ref 96–112)
Creatinine, Ser: 0.69 mg/dL (ref 0.40–1.20)
GFR: 94.54 mL/min (ref >60.00)
Glucose, Bld: 78 mg/dL (ref 70–99)
Potassium: 4.3 meq/L (ref 3.5–5.1)
Sodium: 140 meq/L (ref 135–145)
Total Bilirubin: 0.7 mg/dL (ref 0.2–1.2)
Total Protein: 6.9 g/dL (ref 6.0–8.3)

## 2023-02-09 LAB — TSH: TSH: 1.44 u[IU]/mL (ref 0.35–5.50)

## 2023-02-09 MED ORDER — VALACYCLOVIR HCL 1 G PO TABS: ORAL_TABLET | ORAL | 3 refills | Status: AC

## 2023-02-09 NOTE — Progress Notes (Signed)
 Complete physical exam  Patient: Patricia Welch   DOB: 07/05/1960   63 y.o. Female  MRN: [id number]  Subjective:    Chief Complaint  Patient presents with   Establish Care    Patricia Welch is a 63 y.o. female who presents today to reestablish care and for a complete physical exam. She reports consuming a general diet. The patient does not participate in regular exercise at present. She generally feels well. She reports sleeping well. She does not have additional problems to discuss today.    Discussed the use of AI scribe software for clinical note transcription with the patient, who gave verbal consent to proceed.  History of Present Illness   Patricia Welch is a 63 year old female who presents for a routine physical exam.  She has a history of low iron with anemia, anxiety, a C-section, knee surgery, and LASIK eye surgery on both eyes. She is allergic to penicillins and takes Valtrex  for cold sores, which are usually well-controlled but can be triggered by sun exposure and stress.  She has reached menopause but does not recall the exact age. There is no unusual spotting or bleeding post-menopause.  She is concerned about her risk for diabetes due to her family history, as her mother has diabetes. She does not experience symptoms of diabetes such as unusual thirst, hunger, or urination, although she notes being 'definitely thirsty at night'.  She does not follow any specific diet and does not exercise regularly, but she stays active by caring for her two-year-old granddaughter who lives with her, her husband, and her daughter. She works as a advertising account executive, which involves desk work.        Currently lives with: husband, daughter, and granddaughter Acute concerns or interim problems since last visit: no  Vision concerns: no Dental concerns: no STD concerns: no  ETOH use: occasional Nicotine use: no Recreational drugs/illegal substances: no        Most recent  fall risk assessment:    02/09/2023   10:24 AM  Fall Risk   Falls in the past year? 0  Number falls in past yr: 0  Injury with Fall? 0  Risk for fall due to : No Fall Risks  Follow up Falls evaluation completed     Most recent depression screenings:    02/09/2023   10:24 AM 03/27/2019    1:05 PM  PHQ 2/9 Scores  PHQ - 2 Score 0 0            Patient Care Team: Almarie Waddell NOVAK, NP as PCP - General (Family Medicine) Neysa Inocente PARAS, NP (Inactive) as Nurse Practitioner (Obstetrics and Gynecology)   Outpatient Medications Prior to Visit  Medication Sig   ascorbic acid (VITAMIN C) 500 MG tablet Take 500 mg by mouth daily.   Multiple Vitamin (MULTIVITAMIN) capsule Take 1 capsule by mouth daily.   [DISCONTINUED] valACYclovir  (VALTREX ) 1000 MG tablet 2 po at first sign of cold sore then repeat in 12 hours   No facility-administered medications prior to visit.    ROS All review of systems negative except what is listed in the HPI        Objective:     BP (!) 109/45   Pulse 62   Ht 5' 1 (1.549 m)   Wt 116 lb (52.6 kg)   LMP 04/10/2012   SpO2 100%   BMI 21.92 kg/m    Physical Exam Vitals reviewed.  Constitutional:      General: She  is not in acute distress.    Appearance: Normal appearance. She is not ill-appearing.  HENT:     Head: Normocephalic and atraumatic.     Right Ear: Tympanic membrane normal.     Left Ear: Tympanic membrane normal.     Nose: Nose normal.     Mouth/Throat:     Mouth: Mucous membranes are moist.     Pharynx: Oropharynx is clear.  Eyes:     Extraocular Movements: Extraocular movements intact.     Conjunctiva/sclera: Conjunctivae normal.     Pupils: Pupils are equal, round, and reactive to light.  Neck:     Vascular: No carotid bruit.  Cardiovascular:     Rate and Rhythm: Normal rate and regular rhythm.     Pulses: Normal pulses.     Heart sounds: Normal heart sounds.  Pulmonary:     Effort: Pulmonary effort is normal.      Breath sounds: Normal breath sounds.  Abdominal:     General: Abdomen is flat. Bowel sounds are normal. There is no distension.     Palpations: Abdomen is soft. There is no mass.     Tenderness: There is no abdominal tenderness. There is no right CVA tenderness, left CVA tenderness, guarding or rebound.  Genitourinary:    Comments: Deferred exam Musculoskeletal:        General: Normal range of motion.     Cervical back: Normal range of motion and neck supple. No tenderness.     Right lower leg: No edema.     Left lower leg: No edema.  Lymphadenopathy:     Cervical: No cervical adenopathy.  Skin:    General: Skin is warm and dry.     Capillary Refill: Capillary refill takes less than 2 seconds.  Neurological:     General: No focal deficit present.     Mental Status: She is alert and oriented to person, place, and time. Mental status is at baseline.  Psychiatric:        Mood and Affect: Mood normal.        Behavior: Behavior normal.        Thought Content: Thought content normal.        Judgment: Judgment normal.      No results found for any visits on 02/09/23.     Assessment & Plan:    Routine Health Maintenance and Physical Exam Discussed health promotion and safety including diet and exercise recommendations, dental health, and injury prevention. Tobacco cessation if applicable. Seat belts, sunscreen, smoke detectors, etc.    Immunization History  Administered Date(s) Administered   Influenza-Unspecified 10/15/2022   Moderna Sars-Covid-2 Vaccination 03/23/2019, 04/19/2019, 12/25/2019, 01/12/2021   Td 10/24/2013   Tdap 10/17/2021   Zoster Recombinant(Shingrix ) 06/09/2019, 08/16/2019    Health Maintenance  Topic Date Due   HIV Screening  Never done   Hepatitis C Screening  Never done   Cervical Cancer Screening (HPV/Pap Cotest)  11/10/2022   COVID-19 Vaccine (5 - 2024-25 season) 01/26/2024 (Originally 09/06/2022)   MAMMOGRAM  03/16/2023   Colonoscopy  09/26/2025    DTaP/Tdap/Td (3 - Td or Tdap) 10/18/2031   INFLUENZA VACCINE  Completed   Zoster Vaccines- Shingrix   Completed   HPV VACCINES  Aged Out        Problem List Items Addressed This Visit       Active Problems   Cold sore   No acute symptoms.  Requesting refill of PRN Valtrex       Relevant Medications  valACYclovir  (VALTREX ) 1000 MG tablet   Other Visit Diagnoses       Annual physical exam    -  Primary   Relevant Orders   CBC with Differential/Platelet   Comprehensive metabolic panel   Lipid panel   TSH     Encounter for medical examination to establish care         Encounter for screening for HIV       Relevant Orders   HIV Antibody (routine testing w rflx)     Encounter for hepatitis C screening test for low risk patient       Relevant Orders   Hepatitis C antibody      Return in about 1 year (around 02/09/2024) for physical.     Waddell KATHEE Mon, NP

## 2023-02-09 NOTE — Assessment & Plan Note (Signed)
No acute symptoms.  Requesting refill of PRN Valtrex

## 2023-02-11 ENCOUNTER — Encounter: Payer: Self-pay | Admitting: Family Medicine

## 2023-02-11 LAB — HIV ANTIBODY (ROUTINE TESTING W REFLEX): HIV 1&2 Ab, 4th Generation: NONREACTIVE

## 2023-02-11 LAB — HEPATITIS C ANTIBODY: Hepatitis C Ab: NONREACTIVE

## 2023-02-11 NOTE — Progress Notes (Signed)
 LDL is mildly elevated, but other labs look good! Check cholesterol again next year at annual physical.   Lifestyle factors for lowering cholesterol include: Diet therapy - heart-healthy diet rich in fruits, veggies, fiber-rich whole grains, lean meats, chicken, fish (at least twice a week), fat-free or 1% dairy products; foods low in saturated/trans fats, cholesterol, sodium, and sugar. Mediterranean diet has shown to be very heart healthy. Regular exercise - recommend at least 30 minutes a day, 5 times per week Weight management    The 10-year ASCVD risk score (Arnett DK, et al., 2019) is: 2.1%   Values used to calculate the score:     Age: 63 years     Sex: Female     Is Non-Hispanic African American: No     Diabetic: No     Tobacco smoker: No     Systolic Blood Pressure: 109 mmHg     Is BP treated: No     HDL Cholesterol: 67.6 mg/dL     Total Cholesterol: 199 mg/dL

## 2023-05-05 ENCOUNTER — Encounter: Payer: Self-pay | Admitting: Obstetrics and Gynecology

## 2023-05-05 ENCOUNTER — Ambulatory Visit (INDEPENDENT_AMBULATORY_CARE_PROVIDER_SITE_OTHER): Admitting: Obstetrics and Gynecology

## 2023-05-05 ENCOUNTER — Other Ambulatory Visit (HOSPITAL_COMMUNITY)
Admission: RE | Admit: 2023-05-05 | Discharge: 2023-05-05 | Disposition: A | Discharge status: Home / Self Care | Source: Ambulatory Visit | Attending: Obstetrics and Gynecology | Admitting: Obstetrics and Gynecology

## 2023-05-05 VITALS — BP 100/64 | HR 74 | Ht 63.0 in | Wt 117.0 lb

## 2023-05-05 DIAGNOSIS — Z1331 Encounter for screening for depression: Secondary | ICD-10-CM | POA: Diagnosis not present

## 2023-05-05 DIAGNOSIS — Z01419 Encounter for gynecological examination (general) (routine) without abnormal findings: Secondary | ICD-10-CM | POA: Insufficient documentation

## 2023-05-05 DIAGNOSIS — Z124 Encounter for screening for malignant neoplasm of cervix: Secondary | ICD-10-CM | POA: Diagnosis present

## 2023-05-05 NOTE — Assessment & Plan Note (Signed)
 Cervical cancer screening performed according to ASCCP guidelines. Encouraged annual mammogram screening Colonoscopy UTD DXA UTD Labs and immunizations with her primary Encouraged safe sexual practices as indicated Encouraged healthy lifestyle practices with diet and exercise For patients under 50-61yo, I recommend 1200mg  calcium daily and 600IU of vitamin D daily.

## 2023-05-05 NOTE — Patient Instructions (Signed)

## 2023-05-05 NOTE — Progress Notes (Signed)
 63 y.o. G1P0100 postmenopausal female with history of CIN-2 (s/p LEEP), OAB here for annual exam. Married. 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  No concerns.  Notes frequent urination and nocturia 1-2 times per night.  Okay managing symptoms without meds at this time. LEEP ~26yr ago  Abnormal bleeding: none Pelvic discharge or pain: none Breast mass, nipple discharge or skin changes : none Last PAP: No results found for: "DIAGPAP", "HPVHIGH", "ADEQPAP" 2019 NIL, HPV negative Last mammogram: 03/19/2022 BI-RADS 1, density C, scheduled for May of this year Last colonoscopy: 09/27/2015 BMD: 12/06/2017 normal Sexually active: No Exercising: No Smoker: No  Flowsheet Row Office Visit from 05/05/2023 in [facility]  PHQ-2 Total Score 0        GYN HISTORY: CIN-2, prior LEEP  OB History  Gravida Para Term Preterm AB Living  1 1  1   0  SAB IAB Ectopic Multiple Live Births          # Outcome Date GA Lbr Len/2nd Weight Sex Type Anes PTL Lv  1 Preterm             Past Medical History:  Diagnosis Date   Allergy 1970's   Penicillin   Anemia    low iron sometimes   Anxiety    maybe a little every now & then   Premature delivery    C-SECTION, 241/2 WK - FETAL DISTRESS "[name]" DIED 01-19-2023 SECONDARY RSV- RESPIRATORY INFECTION    Past Surgical History:  Procedure Laterality Date   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     Multiple Vitamin (MULTIVITAMIN) capsule Take 1 capsule by mouth daily.     valACYclovir  (VALTREX ) 1000 MG tablet 2 po at first sign of cold sore then repeat in 12 hours 20 tablet 3   No current facility-administered medications on file  prior to visit.    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: [employer]  Tobacco Use   Smoking status: Never   Smokeless tobacco: Never  Substance and Sexual Activity   Alcohol use: Yes    Comment: maybe a glass of wine every month or so   Drug use: No   Sexual activity: Not Currently    Partners: Male  Other Topics Concern   Not on file  Social History Narrative   Exercise-- no   Social Drivers of Health   Financial Resource Strain: Low Risk  (02/09/2023)   Overall Financial Resource Strain (CARDIA)    Difficulty of Paying Living Expenses: Not hard at all  Food Insecurity: No Food Insecurity (02/09/2023)   Hunger Vital Sign    Worried About Running Out of Food in the Last Year: Never true    Ran Out of Food in the Last Year: Never true  Transportation Needs: No Transportation Needs (02/09/2023)   PRAPARE - Transportation  Lack of Transportation (Medical): No    Lack of Transportation (Non-Medical): No  Physical Activity: Insufficiently Active (02/09/2023)   Exercise Vital Sign    Days of Exercise per Week: 2 days    Minutes of Exercise per Session: 30 min  Stress: No Stress Concern Present (02/09/2023)   [employer] of Occupational Health - Occupational Stress Questionnaire    Feeling of Stress : Only a little  Social Connections: Moderately Integrated (02/09/2023)   Social Connection and Isolation Panel [NHANES]    Frequency of Communication with Friends and Family: Twice a week    Frequency of Social Gatherings with Friends and Family: Once a week    Attends Religious Services: 1 to 4 times per year    Active Member of [employer] or Organizations: No    Attends Engineer, structural: Not on file    Marital Status: Married   Intimate Partner Violence: Unknown (01/09/2022)   Received from [employer], [facility]   HITS    Physically Hurt: Not on file    Insult or Talk Down To: Not on file    Threaten Physical Harm: Not on file    Scream or Curse: Not on file    Family History  Problem Relation Age of Onset   Alcohol abuse Father    Heart disease Father 38       MI   Diabetes Mother    Hypertension Mother    Colon polyps Mother    Kidney disease Mother    Miscarriages / [place] Mother    Colon cancer Neg Hx    Rectal cancer Neg Hx    Stomach cancer Neg Hx    Esophageal cancer Neg Hx     Allergies  Allergen Reactions   Penicillins Rash      PE Today's Vitals   05/05/23 1337  BP: 100/64  Pulse: 74  SpO2: 98%  Weight: 117 lb (53.1 kg)  Height: 5\' 3"  (1.6 m)   Body mass index is 20.73 kg/m.  Physical Exam Vitals reviewed. Exam conducted with a chaperone present.  Constitutional:      General: She is not in acute distress.    Appearance: Normal appearance.  HENT:     Head: Normocephalic and atraumatic.     Nose: Nose normal.  Eyes:     Extraocular Movements: Extraocular movements intact.     Conjunctiva/sclera: Conjunctivae normal.  Neck:     Thyroid: No thyroid mass, thyromegaly or thyroid tenderness.  Pulmonary:     Effort: Pulmonary effort is normal.  Chest:     Chest wall: No mass or tenderness.  Breasts:    Right: Normal. No swelling, mass, nipple discharge, skin change or tenderness.     Left: Normal. No swelling, mass, nipple discharge, skin change or tenderness.  Abdominal:     General: There is no distension.     Palpations: Abdomen is soft.     Tenderness: There is no abdominal tenderness.  Genitourinary:    General: Normal vulva.     Exam position: Lithotomy position.     Urethra: No prolapse.     Vagina: Normal. No vaginal discharge or bleeding.     Cervix: Normal. No lesion.     Uterus: Normal. Not enlarged and not tender.      Adnexa: Right adnexa  normal and left adnexa normal.  Musculoskeletal:        General: Normal range of motion.     Cervical back: Normal range of motion.  Lymphadenopathy:     Upper Body:     Right upper body: No axillary adenopathy.     Left upper body: No axillary adenopathy.     Lower Body: No right inguinal adenopathy. No left inguinal adenopathy.  Skin:    General: Skin is warm and dry.  Neurological:     General: No focal deficit present.     Mental Status: She is alert.  Psychiatric:        Mood and Affect: Mood normal.        Behavior: Behavior normal.      Assessment and Plan:        Well woman exam with routine gynecological exam Assessment & Plan: Cervical cancer screening performed according to ASCCP guidelines. Encouraged annual mammogram screening Colonoscopy UTD DXA UTD Labs and immunizations with her primary Encouraged safe sexual practices as indicated Encouraged healthy lifestyle practices with diet and exercise For patients under 50-70yo, I recommend 1200mg  calcium daily and 600IU of vitamin D  daily.    Cervical cancer screening -     Cytology - PAP  Negative depression screening   Patricia Welch Hadassah Letters, MD

## 2023-05-07 LAB — CYTOLOGY - PAP
Comment: NEGATIVE
Diagnosis: NEGATIVE
High risk HPV: NEGATIVE

## 2023-05-10 ENCOUNTER — Encounter: Payer: Self-pay | Admitting: Obstetrics and Gynecology

## 2023-05-20 LAB — HM MAMMOGRAPHY
